# Patient Record
Sex: Male | Born: 1955 | Race: White | Hispanic: No | Smoking: Former smoker
Health system: Southern US, Community
[De-identification: ages and names within clinical notes are randomized; demographics above are authoritative.]

## PROBLEM LIST (undated history)

## (undated) DIAGNOSIS — I2510 Atherosclerotic heart disease of native coronary artery without angina pectoris: Secondary | ICD-10-CM

## (undated) DIAGNOSIS — I519 Heart disease, unspecified: Secondary | ICD-10-CM

## (undated) DIAGNOSIS — Z87891 Personal history of nicotine dependence: Secondary | ICD-10-CM

## (undated) DIAGNOSIS — Z91199 Patient's noncompliance with other medical treatment and regimen due to unspecified reason: Secondary | ICD-10-CM

## (undated) DIAGNOSIS — Z95810 Presence of automatic (implantable) cardiac defibrillator: Secondary | ICD-10-CM

## (undated) DIAGNOSIS — E785 Hyperlipidemia, unspecified: Secondary | ICD-10-CM

## (undated) DIAGNOSIS — Z87442 Personal history of urinary calculi: Secondary | ICD-10-CM

## (undated) DIAGNOSIS — I2109 ST elevation (STEMI) myocardial infarction involving other coronary artery of anterior wall: Secondary | ICD-10-CM

## (undated) DIAGNOSIS — I10 Essential (primary) hypertension: Secondary | ICD-10-CM

## (undated) DIAGNOSIS — J449 Chronic obstructive pulmonary disease, unspecified: Secondary | ICD-10-CM

## (undated) DIAGNOSIS — Z9119 Patient's noncompliance with other medical treatment and regimen: Secondary | ICD-10-CM

## (undated) DIAGNOSIS — N289 Disorder of kidney and ureter, unspecified: Secondary | ICD-10-CM

## (undated) HISTORY — DX: Personal history of nicotine dependence: Z87.891

## (undated) HISTORY — DX: Essential (primary) hypertension: I10

## (undated) HISTORY — DX: Heart disease, unspecified: I51.9

## (undated) HISTORY — DX: Atherosclerotic heart disease of native coronary artery without angina pectoris: I25.10

## (undated) HISTORY — DX: Patient's noncompliance with other medical treatment and regimen: Z91.19

## (undated) HISTORY — DX: Disorder of kidney and ureter, unspecified: N28.9

## (undated) HISTORY — PX: CORONARY ANGIOPLASTY WITH STENT PLACEMENT: SHX49

## (undated) HISTORY — DX: Hyperlipidemia, unspecified: E78.5

## (undated) HISTORY — PX: TONSILLECTOMY AND ADENOIDECTOMY: SUR1326

## (undated) HISTORY — PX: TYMPANOPLASTY: SHX33

## (undated) HISTORY — DX: Patient's noncompliance with other medical treatment and regimen due to unspecified reason: Z91.199

---

## 2001-05-01 ENCOUNTER — Other Ambulatory Visit: Admission: RE | Admit: 2001-05-01 | Discharge: 2001-05-01 | Payer: Self-pay | Admitting: Otolaryngology

## 2002-09-02 ENCOUNTER — Emergency Department (HOSPITAL_COMMUNITY): Admission: EM | Admit: 2002-09-02 | Discharge: 2002-09-02 | Payer: Self-pay | Admitting: Emergency Medicine

## 2003-08-18 ENCOUNTER — Ambulatory Visit (HOSPITAL_COMMUNITY): Admission: RE | Admit: 2003-08-18 | Discharge: 2003-08-19 | Payer: Self-pay | Admitting: Cardiovascular Disease

## 2003-10-17 DIAGNOSIS — I2109 ST elevation (STEMI) myocardial infarction involving other coronary artery of anterior wall: Secondary | ICD-10-CM

## 2003-10-17 HISTORY — DX: ST elevation (STEMI) myocardial infarction involving other coronary artery of anterior wall: I21.09

## 2003-12-03 ENCOUNTER — Emergency Department (HOSPITAL_COMMUNITY): Admission: EM | Admit: 2003-12-03 | Discharge: 2003-12-03 | Payer: Self-pay | Admitting: Emergency Medicine

## 2003-12-05 ENCOUNTER — Emergency Department (HOSPITAL_COMMUNITY): Admission: EM | Admit: 2003-12-05 | Discharge: 2003-12-05 | Payer: Self-pay | Admitting: Emergency Medicine

## 2003-12-10 ENCOUNTER — Inpatient Hospital Stay (HOSPITAL_COMMUNITY): Admission: EM | Admit: 2003-12-10 | Discharge: 2003-12-19 | Payer: Self-pay | Admitting: Emergency Medicine

## 2003-12-10 ENCOUNTER — Emergency Department (HOSPITAL_COMMUNITY): Admission: EM | Admit: 2003-12-10 | Discharge: 2003-12-10 | Payer: Self-pay | Admitting: Emergency Medicine

## 2005-11-30 IMAGING — CT CT PELVIS W/O CM
1 series · 15 of 32 positions shown, 19 images · non-contrast
Comparison: None.

** THIS REPORT HAS BEEN UPDATED TO INCLUDE ALL ASSOCIATED EXAMS ? 12/07/03**
CLINICAL DATA: 47-year-old male with left flank pain and nausea.  Evaluate for kidney stones. 
 ABDOMEN AND PELVIS CT WITHOUT CONTRAST ? 12/03/03

[Series 2: renal stone · axial · 0.73mm/px · z∈[-486,-121]mm · 15 of 82 slices shown, 19 images]
[im 6/82  soft-tissue]
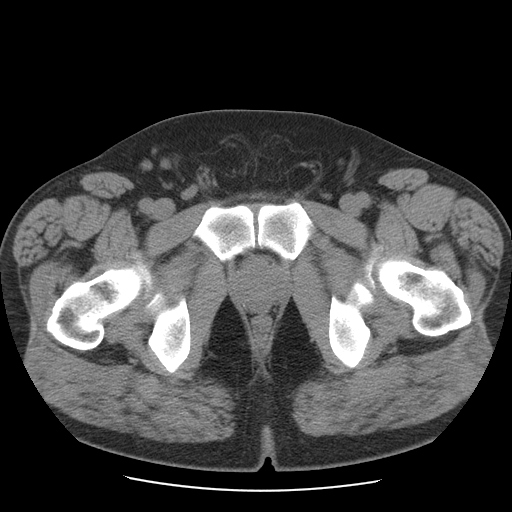
[im 6/82  bone]
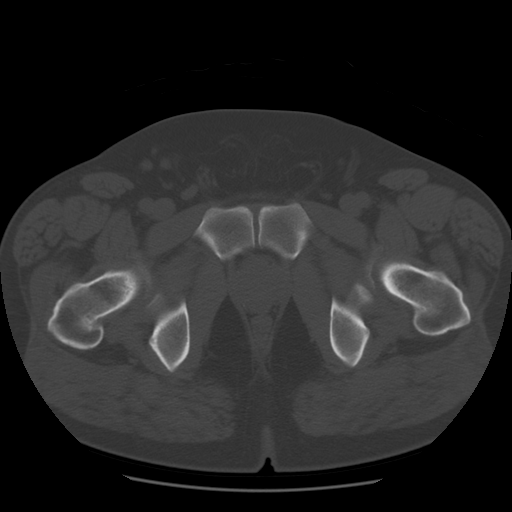
[im 11/82  soft-tissue]
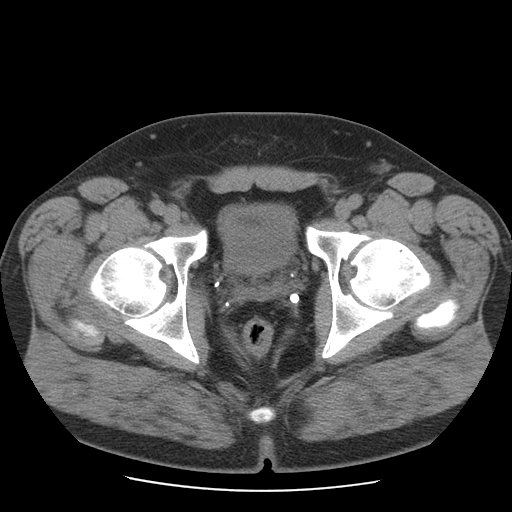
[im 16/82  soft-tissue]
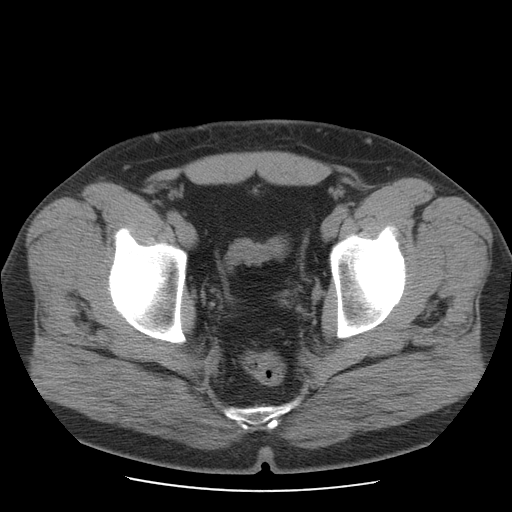
[im 24/82  soft-tissue]
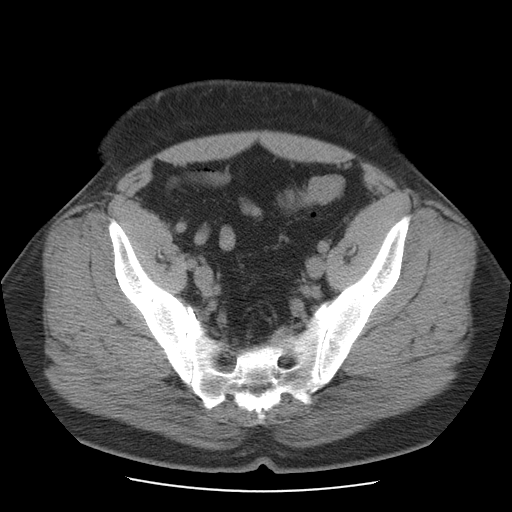
[im 29/82  soft-tissue]
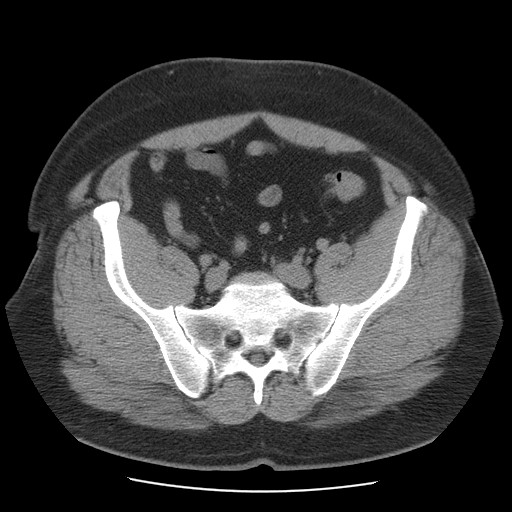
[im 34/82  soft-tissue]
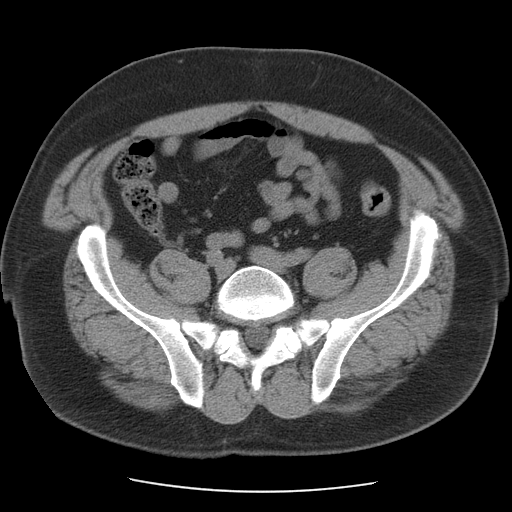
[im 42/82  soft-tissue]
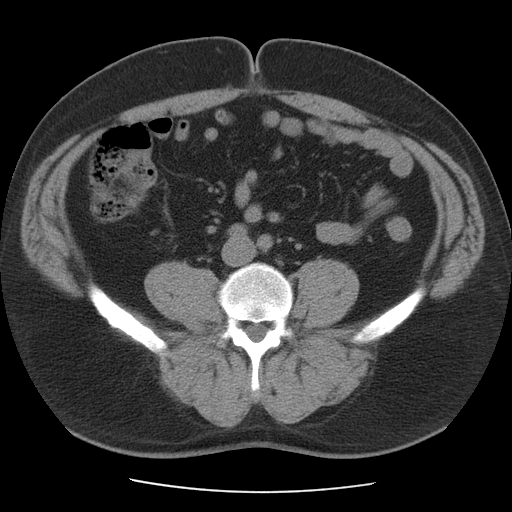
[im 48/82  soft-tissue]
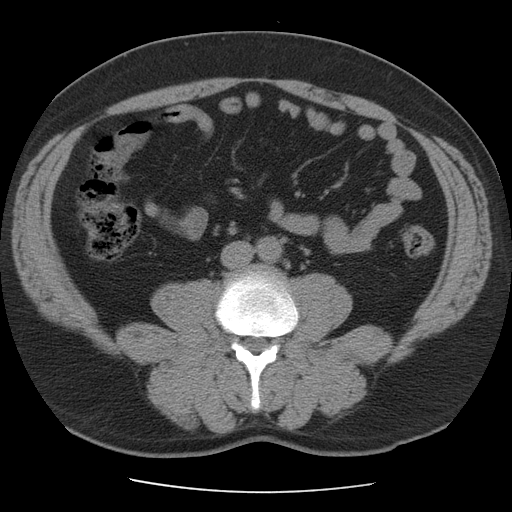
[im 53/82  soft-tissue]
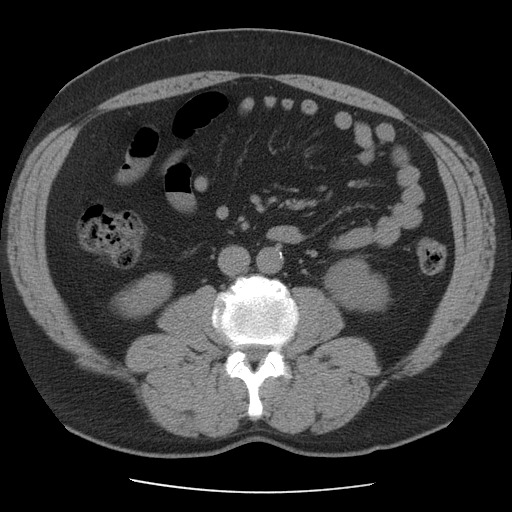
[im 53/82  bone]
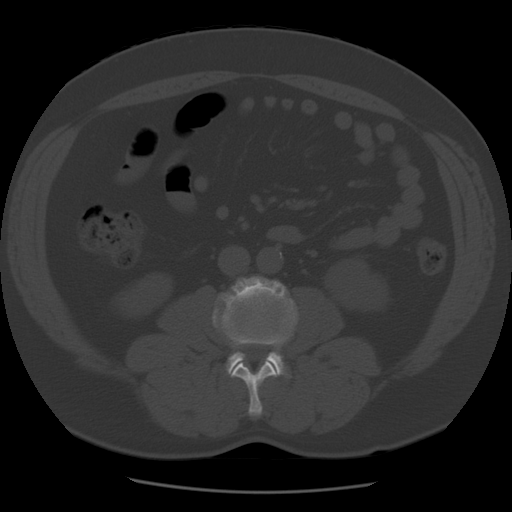
[im 58/82  soft-tissue]
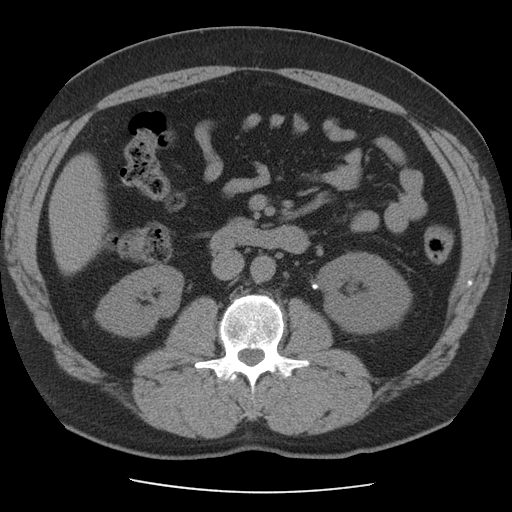
[im 66/82  soft-tissue]
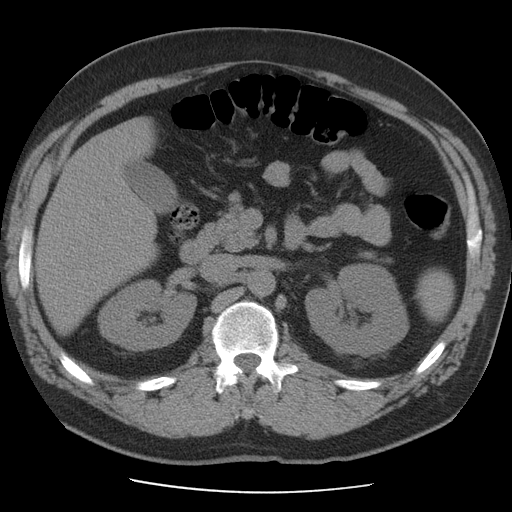
[im 71/82  soft-tissue]
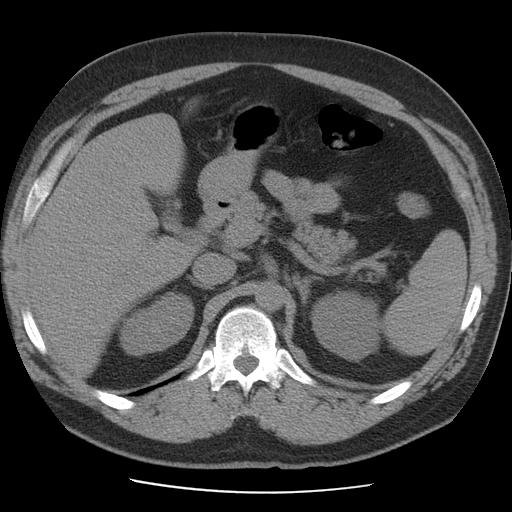
[im 71/82  lung]
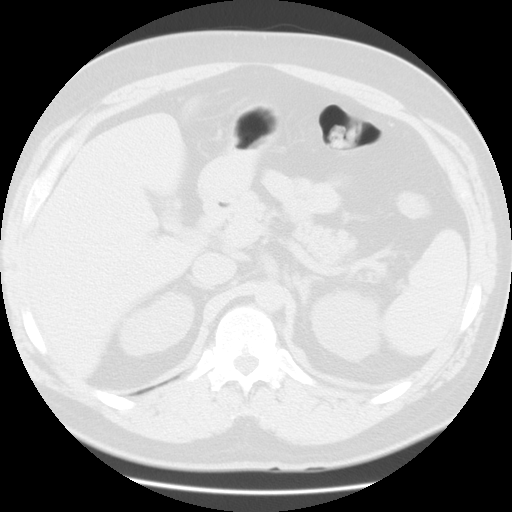
[im 74/82  lung]
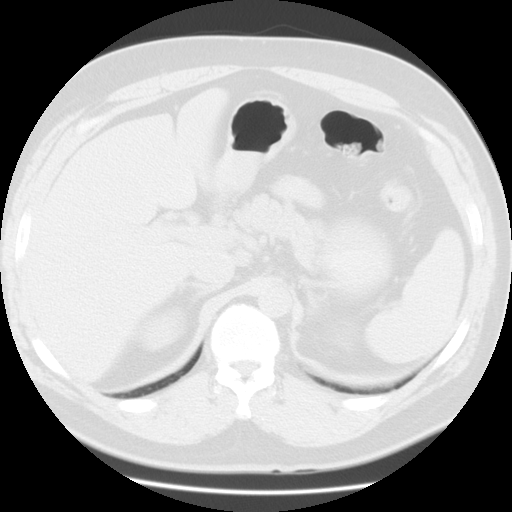
[im 76/82  soft-tissue]
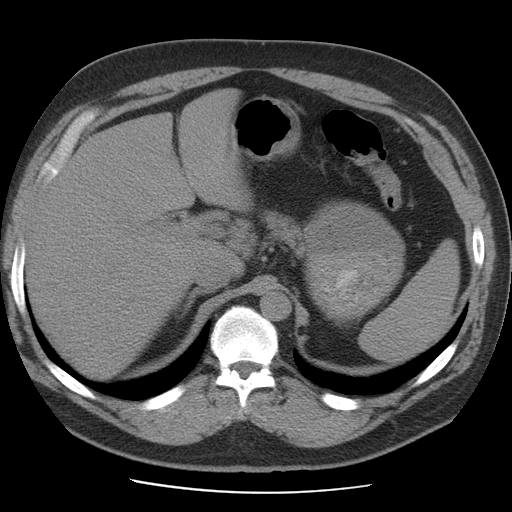
[im 76/82  lung]
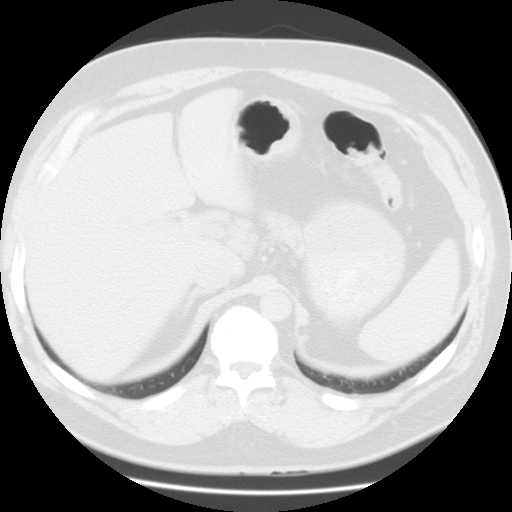
[im 79/82  lung]
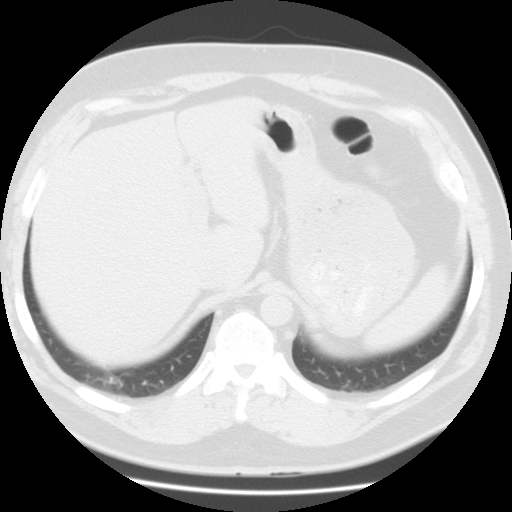

[15 of 32 positions shown; findings below may reference images not displayed]

5-mm contiguous axial images were obtained from the upper abdomen to the pubic symphysis without oral or IV contrast.
FINDINGS: ABDOMEN 
 The liver and spleen have normal uninfused features although neither organ has been imaged in its entirety, and the lack of intravenous contrast lessens sensitivity for evaluation.  The gallbladder, pancreas, duodenum, and adrenal glands are unremarkable.  There is no free fluid or lymphadenopathy.  Two non-obstructing stones are seen in the right kidney, the larger measuring approximately 2.0 mm.  A 4.0-mm stone is seen at the left ureteropelvic junction with mild left perinephric and peripelvic stranding.
IMPRESSION: A 4.0-mm left ureteropelvic junction stone generates no hydronephrosis, but there is mild left perinephric edema.  
 Non-obstructing right renal calculi.
 PELVIS 
 Imaging through the anatomic pelvis shows no distal ureteral or bladder calculi.  There is no evidence for free fluid or lymphadenopathy.  Diverticular change characterizes the sigmoid colon.
IMPRESSION: Sigmoid diverticulosis in an otherwise unremarkable CT exam of the pelvis.

## 2005-12-09 IMAGING — CR DG CHEST 1V PORT
1 series · 1 of 1 positions shown · non-contrast
Comparison: none

CLINICAL DATA: Acute MI
SINGLE PORTABLE CHEST RADIOGRAPH, 12/12/03, 6600 HOURS
Comparison 12/10/03.

[view not recorded]
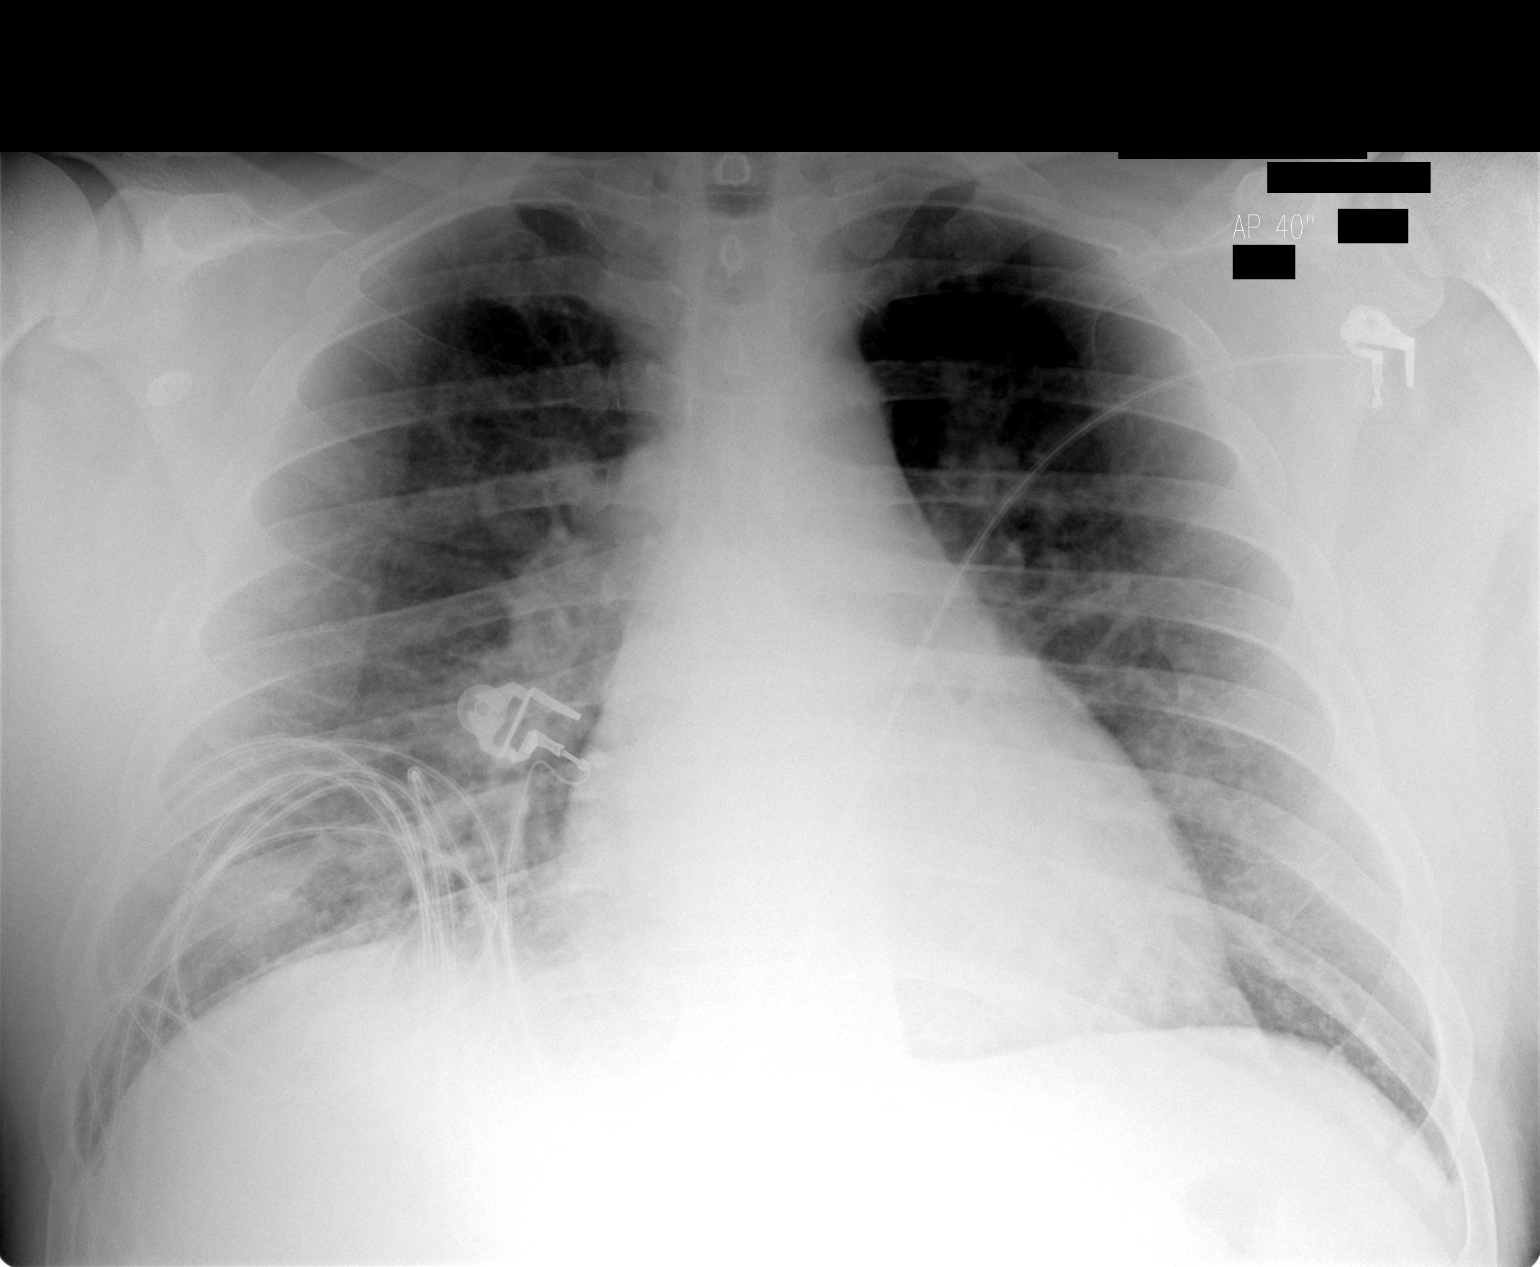

[1 of 1 positions shown; findings below may reference images not displayed]

FINDINGS: There is still cardiomegaly and central vascular congestion but no definite CHF.  Right lower lobe atelectasis is suspected, partially obscured by telemetry leads.  
IMPRESSION 
Persistent cardiomegaly and vascular congestion.
Minor right lower lobe atelectasis.

## 2013-04-12 ENCOUNTER — Other Ambulatory Visit: Payer: Self-pay | Admitting: Cardiovascular Disease

## 2013-04-14 NOTE — Telephone Encounter (Signed)
Rx was sent to pharmacy electronically. 

## 2013-05-18 ENCOUNTER — Other Ambulatory Visit: Payer: Self-pay | Admitting: Cardiovascular Disease

## 2013-05-19 ENCOUNTER — Other Ambulatory Visit: Payer: Self-pay | Admitting: *Deleted

## 2013-05-19 MED ORDER — CLOPIDOGREL BISULFATE 75 MG PO TABS: 75.0000 mg | ORAL_TABLET | Freq: Every day | ORAL | Status: DC

## 2013-05-19 NOTE — Telephone Encounter (Signed)
Rx was sent to pharmacy electronically. 

## 2013-06-03 ENCOUNTER — Other Ambulatory Visit: Payer: Self-pay | Admitting: Cardiovascular Disease

## 2013-07-01 ENCOUNTER — Other Ambulatory Visit: Payer: Self-pay | Admitting: Cardiovascular Disease

## 2013-07-01 NOTE — Telephone Encounter (Signed)
Rx was sent to pharmacy electronically. 

## 2013-07-09 ENCOUNTER — Other Ambulatory Visit: Payer: Self-pay | Admitting: Cardiovascular Disease

## 2013-07-09 NOTE — Telephone Encounter (Signed)
Rx was sent to pharmacy electronically. 

## 2013-07-23 ENCOUNTER — Encounter: Payer: Self-pay | Admitting: Cardiovascular Disease

## 2013-07-23 ENCOUNTER — Ambulatory Visit (INDEPENDENT_AMBULATORY_CARE_PROVIDER_SITE_OTHER): Payer: 59 | Admitting: Cardiovascular Disease

## 2013-07-23 VITALS — BP 132/80 | HR 79 | Ht 71.0 in | Wt 260.8 lb

## 2013-07-23 DIAGNOSIS — I2510 Atherosclerotic heart disease of native coronary artery without angina pectoris: Secondary | ICD-10-CM

## 2013-07-23 DIAGNOSIS — E7800 Pure hypercholesterolemia, unspecified: Secondary | ICD-10-CM | POA: Insufficient documentation

## 2013-07-23 DIAGNOSIS — E785 Hyperlipidemia, unspecified: Secondary | ICD-10-CM

## 2013-07-23 DIAGNOSIS — I10 Essential (primary) hypertension: Secondary | ICD-10-CM

## 2013-07-23 NOTE — Patient Instructions (Signed)
Your physician recommends that you schedule a follow-up appointment in: 1 year  

## 2013-07-23 NOTE — Assessment & Plan Note (Signed)
Well-controlled on current medications 

## 2013-07-23 NOTE — Progress Notes (Signed)
07/23/2013 Tyrone Haas   [birth year]  [id number]  Primary Physician No PCP Per Patient Primary Cardiologist: Runell Gess MD [name]   HPI:  The patient is a 57 year old mildly overweight separated Caucasian male, father of 4 children, who I last saw a year ago. He has a history of CAD status post LAD and diagonal branch bifurcation stenting, November of 2004, by myself. 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 He was studied urgently and underwent thrombectomy and re-intervention. At his initial catheterization, EF was 50%, but subsequent to that, at the time of his second catheterization, his EF was 25%, and by echocardiogram 3 years ago EF had come up to 35% to 40%. Myoview showed a large scar in the LAD territory. He stopped smoking at that time. His other problems include hypertension and hyperlipidemia. He denies chest pain or shortness of breath    Current Outpatient Prescriptions  Medication Sig Dispense Refill  . ALREX 0.2 % SUSP Place 1 drop into both eyes daily.      Marland Kitchen atorvastatin (LIPITOR) 40 MG tablet TAKE 1 TABLET AT BEDTIME  15 tablet  0  . carvedilol (COREG) 6.25 MG tablet Take 1 tablet by mouth daily.      . clopidogrel (PLAVIX) 75 MG tablet TAKE 1 TABLET (75 MG TOTAL) BY MOUTH DAILY.  15 tablet  0  . NIASPAN 500 MG CR tablet TAKE 1 TABLET BY MOUTH EVERY DAY  15 tablet  0  . ramipril (ALTACE) 2.5 MG capsule TAKE 1 CAPSULE EVERY DAY  15 capsule  0   No current facility-administered medications for this visit.    Not on File  History   Social History  . Marital Status: Divorced    Spouse Name: N/A    Number of Children: N/A  . Years of Education: N/A   Occupational History  . Not on file.   Social History Main Topics  . Smoking status: Former Smoker    Quit date: 07/23/2009  . Smokeless tobacco: Not on file  .  Alcohol Use: Yes  . Drug Use: No  . Sexual Activity: Not on file   Other Topics Concern  . Not on file   Social History Narrative  . No narrative on file     Review of Systems: General: negative for chills, fever, night sweats or weight changes.  Cardiovascular: negative for chest pain, dyspnea on exertion, edema, orthopnea, palpitations, paroxysmal nocturnal dyspnea or shortness of breath Dermatological: negative for rash Respiratory: negative for cough or wheezing Urologic: negative for hematuria Abdominal: negative for nausea, vomiting, diarrhea, bright red blood per rectum, melena, or hematemesis Neurologic: negative for visual changes, syncope, or dizziness All other systems reviewed and are otherwise negative except as noted above.    Blood pressure 132/80, pulse 79, height 5\' 11"  (1.803 m), weight 260 lb 12.8 oz (118.298 kg).  General appearance: alert and no distress Neck: no adenopathy, no carotid bruit, no JVD, supple, symmetrical, trachea midline and thyroid not enlarged, symmetric, no tenderness/mass/nodules Lungs: clear to auscultation bilaterally Heart: regular rate and rhythm, S1, S2 normal, no murmur, click, rub or gallop Extremities: extremities normal, atraumatic, no cyanosis or edema  EKG sinus rhythm at 79 with Q waves in V1 through V5 and low limb voltage  ASSESSMENT AND PLAN:   Coronary artery disease Status post LAD diagonal branch bifurcation stenting November 2004. 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 Urgently studied and underwent thrombectomy every intervention. Is initially it was 55% which was 25% acutely and ultimately improved to 35-40%. Myoview showed a large scar in the anterior wall. He denies chest pain or shortness of breath.  Essential hypertension Well-controlled on current medications  Hyperlipidemia On statin  therapy followed by his PCP      Runell Gess MD [facility], [facility] 07/23/2013 4:10 PM

## 2013-07-23 NOTE — Assessment & Plan Note (Signed)
On statin therapy followed by his PCP 

## 2013-07-23 NOTE — Assessment & Plan Note (Signed)
Status post LAD diagonal branch bifurcation stenting November 2004. 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 Urgently studied and underwent thrombectomy every intervention. Is initially it was 55% which was 25% acutely and ultimately improved to 35-40%. Myoview showed a large scar in the anterior wall. He denies chest pain or shortness of breath.

## 2013-07-24 ENCOUNTER — Encounter: Payer: Self-pay | Admitting: Cardiovascular Disease

## 2013-07-28 ENCOUNTER — Other Ambulatory Visit: Payer: Self-pay | Admitting: Cardiovascular Disease

## 2013-07-28 NOTE — Telephone Encounter (Signed)
Rx was sent to pharmacy electronically. 

## 2013-11-20 ENCOUNTER — Other Ambulatory Visit: Payer: Self-pay | Admitting: Cardiovascular Disease

## 2013-11-20 NOTE — Telephone Encounter (Signed)
Rx was sent to pharmacy electronically. 

## 2014-01-20 ENCOUNTER — Other Ambulatory Visit: Payer: Self-pay

## 2014-01-20 MED ORDER — ATORVASTATIN CALCIUM 40 MG PO TABS: 40.0000 mg | ORAL_TABLET | Freq: Every day | ORAL | Status: DC

## 2014-01-20 MED ORDER — CLOPIDOGREL BISULFATE 75 MG PO TABS
75.0000 mg | ORAL_TABLET | Freq: Every day | ORAL | Status: DC
Start: 2014-01-20 — End: 2014-08-24

## 2014-01-20 NOTE — Telephone Encounter (Signed)
Rx was sent to pharmacy electronically. 

## 2014-01-21 ENCOUNTER — Other Ambulatory Visit: Payer: Self-pay | Admitting: *Deleted

## 2014-01-23 ENCOUNTER — Other Ambulatory Visit: Payer: Self-pay | Admitting: *Deleted

## 2014-01-23 MED ORDER — RAMIPRIL 2.5 MG PO CAPS: 2.5000 mg | ORAL_CAPSULE | Freq: Every day | ORAL | Status: DC

## 2014-03-10 ENCOUNTER — Other Ambulatory Visit: Payer: Self-pay

## 2014-03-10 MED ORDER — NIACIN ER (ANTIHYPERLIPIDEMIC) 500 MG PO TBCR: 500.0000 mg | EXTENDED_RELEASE_TABLET | Freq: Every day | ORAL | Status: DC

## 2014-03-10 NOTE — Telephone Encounter (Signed)
Rx was sent to pharmacy electronically. 

## 2014-08-24 ENCOUNTER — Other Ambulatory Visit: Payer: Self-pay | Admitting: Cardiovascular Disease

## 2014-08-24 NOTE — Telephone Encounter (Signed)
Rx refill sent to patient pharmacy   

## 2014-09-17 ENCOUNTER — Other Ambulatory Visit: Payer: Self-pay | Admitting: Cardiovascular Disease

## 2014-09-26 ENCOUNTER — Other Ambulatory Visit: Payer: Self-pay | Admitting: Cardiovascular Disease

## 2014-09-28 NOTE — Telephone Encounter (Signed)
Rx refill sent to patient pharmacy  With note that appointment must be made for additional refills

## 2014-10-18 ENCOUNTER — Other Ambulatory Visit: Payer: Self-pay | Admitting: Cardiovascular Disease

## 2014-10-24 ENCOUNTER — Other Ambulatory Visit: Payer: Self-pay | Admitting: Cardiovascular Disease

## 2014-10-26 ENCOUNTER — Other Ambulatory Visit: Payer: Self-pay | Admitting: Cardiovascular Disease

## 2014-10-28 NOTE — Telephone Encounter (Signed)
Rx(s) sent to pharmacy electronically. Last OV 07/2013.  Note given to scheduler to contact patient for appointment.

## 2014-10-28 NOTE — Telephone Encounter (Signed)
Rx(s) sent to pharmacy electronically. OV 07/2013 Note given to scheduler that patient needs OV

## 2014-10-28 NOTE — Telephone Encounter (Signed)
Rx(s) sent to pharmacy electronically. Note given to scheduler to contact patient for appointment

## 2014-11-08 ENCOUNTER — Other Ambulatory Visit: Payer: Self-pay | Admitting: Cardiovascular Disease

## 2014-11-09 ENCOUNTER — Other Ambulatory Visit: Payer: Self-pay | Admitting: Cardiovascular Disease

## 2014-11-11 NOTE — Telephone Encounter (Signed)
Rx(s) sent to pharmacy electronically. Staff message sent to [facility] (scheduler) to contact patient for appointment

## 2014-11-11 NOTE — Telephone Encounter (Signed)
Rx(s) sent to pharmacy electronically. Staff message sent to [facility] (scheduler) to contact patient for appointment

## 2014-11-11 NOTE — Telephone Encounter (Signed)
Need new prescriptions for Carvedilol ,Clopidogrel and Atorvastatin #90 and refills please.

## 2014-11-13 ENCOUNTER — Telehealth: Payer: Self-pay | Admitting: Cardiovascular Disease

## 2014-11-24 ENCOUNTER — Telehealth: Payer: Self-pay | Admitting: Cardiovascular Disease

## 2014-11-24 ENCOUNTER — Other Ambulatory Visit: Payer: Self-pay | Admitting: Cardiovascular Disease

## 2014-11-24 NOTE — Telephone Encounter (Signed)
Rx(s) sent to pharmacy electronically. Patient needs an appointment. Message routed to scheduler to call patient to schedule an appointment.

## 2014-11-25 ENCOUNTER — Other Ambulatory Visit: Payer: Self-pay | Admitting: Cardiovascular Disease

## 2014-11-25 NOTE — Telephone Encounter (Signed)
Rx(s) sent to pharmacy electronically. 10-day supply sent to pharmacy. Patient hasn't made appointment and we have not been able to get in touch with patient to schedule an appointment.

## 2014-11-25 NOTE — Telephone Encounter (Signed)
Closed encounter °

## 2014-12-03 ENCOUNTER — Other Ambulatory Visit: Payer: Self-pay | Admitting: Cardiovascular Disease

## 2014-12-04 NOTE — Telephone Encounter (Signed)
Patient needs to make appointment. Attempted to call but patient does not have VM and will send calls to VM. Patient needs appointment. Plavix refilled #5, Lipitor and Ramipril denied and req. To be sent to PCP

## 2014-12-05 ENCOUNTER — Other Ambulatory Visit: Payer: Self-pay | Admitting: Cardiovascular Disease

## 2014-12-07 NOTE — Telephone Encounter (Signed)
Spoke with pharmacy to confirm patients phone number. Number was changed in patients chart. Patient needs to make an appointment for additional refills. Patient has not been seen since Oct.2014.

## 2014-12-11 ENCOUNTER — Other Ambulatory Visit: Payer: Self-pay

## 2014-12-11 MED ORDER — CLOPIDOGREL BISULFATE 75 MG PO TABS: ORAL_TABLET | ORAL | Status: DC

## 2014-12-22 ENCOUNTER — Other Ambulatory Visit: Payer: Self-pay | Admitting: Cardiovascular Disease

## 2014-12-28 ENCOUNTER — Other Ambulatory Visit: Payer: Self-pay | Admitting: Cardiovascular Disease

## 2015-01-06 ENCOUNTER — Other Ambulatory Visit: Payer: Self-pay | Admitting: Cardiovascular Disease

## 2015-01-06 NOTE — Telephone Encounter (Signed)
Rx(s) sent to pharmacy electronically. OV 01/11/15 with Dr. Gwenlyn Found

## 2015-01-09 ENCOUNTER — Other Ambulatory Visit: Payer: Self-pay | Admitting: Cardiovascular Disease

## 2015-01-11 ENCOUNTER — Ambulatory Visit (INDEPENDENT_AMBULATORY_CARE_PROVIDER_SITE_OTHER): Payer: 59 | Admitting: Cardiovascular Disease

## 2015-01-11 ENCOUNTER — Encounter: Payer: Self-pay | Admitting: Cardiovascular Disease

## 2015-01-11 VITALS — BP 120/70 | HR 66 | Ht 71.0 in | Wt 248.9 lb

## 2015-01-11 DIAGNOSIS — I10 Essential (primary) hypertension: Secondary | ICD-10-CM

## 2015-01-11 DIAGNOSIS — E785 Hyperlipidemia, unspecified: Secondary | ICD-10-CM

## 2015-01-11 DIAGNOSIS — I2510 Atherosclerotic heart disease of native coronary artery without angina pectoris: Secondary | ICD-10-CM

## 2015-01-11 DIAGNOSIS — I2583 Coronary atherosclerosis due to lipid rich plaque: Secondary | ICD-10-CM

## 2015-01-11 NOTE — Assessment & Plan Note (Signed)
History of hypertension blood pressure measured at 120/70. He is on carvedilol and ramipril. Continue current meds at current dosing

## 2015-01-11 NOTE — Progress Notes (Signed)
01/11/2015 Tyrone Haas   [birth year]  [id number]  Primary Physician Bonnita Nasuti, MD Primary Cardiologist: Lorretta Harp MD [name]   HPI:  The patient is a 59 year old mildly overweight separated Caucasian male, father of 4 children, who I last saw 07/23/13.Tyrone Haas He has a history of CAD status post LAD and diagonal branch bifurcation stenting, November of 2004, by myself. 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 He was studied urgently and underwent thrombectomy and re-intervention. At his initial catheterization, EF was 50%, but subsequent to that, at the time of his second catheterization, his EF was 25%, and by echocardiogram 3 years ago EF had come up to 35% to 40%. Myoview showed a large scar in the LAD territory. He stopped smoking at that time. His other problems include hypertension and hyperlipidemia. He denies chest pain or shortness of breath   Current Outpatient Prescriptions  Medication Sig Dispense Refill  . atorvastatin (LIPITOR) 40 MG tablet Take 1 tablet (40 mg total) by mouth daily. 30 tablet 0  . carvedilol (COREG) 6.25 MG tablet TAKE 1/2 TABLET BY MOUTH 2 (TWO) TIMES DAILY. **MUST MAKE APPOINTMENT FOR REFILLS** 10 tablet 0  . clopidogrel (PLAVIX) 75 MG tablet Keep appointment with Dr.Alijah Akram 3//28/16 30 tablet 0  . NIASPAN 500 MG CR tablet TAKE 1 TABLET BY MOUTH AT BEDTIME MUST MAKE APPT FOR REFILLS 20 tablet 0  . ramipril (ALTACE) 2.5 MG capsule Take 1 capsule (2.5 mg total) by mouth daily. 30 capsule 0   No current facility-administered medications for this visit.    No Known Allergies  History   Social History  . Marital Status: Divorced    Spouse Name: N/A  . Number of Children: N/A  . Years of Education: N/A   Occupational History  . Not on file.   Social History Main Topics  . Smoking status: Former Smoker    Quit  date: 07/23/2009  . Smokeless tobacco: Not on file  . Alcohol Use: Yes  . Drug Use: No  . Sexual Activity: Not on file   Other Topics Concern  . Not on file   Social History Narrative     Review of Systems: General: negative for chills, fever, night sweats or weight changes.  Cardiovascular: negative for chest pain, dyspnea on exertion, edema, orthopnea, palpitations, paroxysmal nocturnal dyspnea or shortness of breath Dermatological: negative for rash Respiratory: negative for cough or wheezing Urologic: negative for hematuria Abdominal: negative for nausea, vomiting, diarrhea, bright red blood per rectum, melena, or hematemesis Neurologic: negative for visual changes, syncope, or dizziness All other systems reviewed and are otherwise negative except as noted above.    Blood pressure 120/70, pulse 66, height 5\' 11"  (1.803 m), weight 248 lb 14.4 oz (112.9 kg).  General appearance: alert and no distress Neck: no adenopathy, no carotid bruit, no JVD, supple, symmetrical, trachea midline and thyroid not enlarged, symmetric, no tenderness/mass/nodules Lungs: clear to auscultation bilaterally Heart: regular rate and rhythm, S1, S2 normal, no murmur, click, rub or gallop Extremities: extremities normal, atraumatic, no cyanosis or edema  EKG normal sinus rhythm at 66 with Q waves in V1 through V4 and low limb voltage unchanged from prior EKGs. I personally reviewed this EKG  ASSESSMENT AND PLAN:   Hyperlipidemia History of hyperlipidemia on atorvastatin 40 mg a day followed by his PCP   Essential hypertension History of hypertension blood pressure  measured at 120/70. He is on carvedilol and ramipril. Continue current meds at current dosing   Coronary artery disease History of coronary artery disease status post LAD and diagonal branch bifurcation stenting November 2004 by myself. Unfortunately he stopped his Plavix presented 12/10/03 with a large anterior wall cardial infarction  related to subacute stent thrombosis. He was urgently studied and underwent thrombectomy and reactive intervention. His EF at that time was 25% which all to wake him up to 35-40% 3 years later. Myoview stress test that showed a large scar in the LAD territory. He denies chest pain or shortness of breath.       Lorretta Harp MD FACP,FACC,FAHA, [facility] 01/11/2015 4:07 PM

## 2015-01-11 NOTE — Assessment & Plan Note (Signed)
History of hyperlipidemia on atorvastatin 40 mg a day followed by his PCP 

## 2015-01-11 NOTE — Telephone Encounter (Signed)
Rx(s) sent to pharmacy electronically.  

## 2015-01-11 NOTE — Assessment & Plan Note (Signed)
History of coronary artery disease status post LAD and diagonal branch bifurcation stenting November 2004 by myself. 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 He was urgently studied and underwent thrombectomy and reactive intervention. His EF at that time was 25% which all to wake him up to 35-40% 3 years later. Myoview stress test that showed a large scar in the LAD territory. He denies chest pain or shortness of breath.

## 2015-01-11 NOTE — Patient Instructions (Signed)
Dr.Berry wants you to follow-up in: ONE YEAR. You will receive a reminder letter in the mail two months in advance. If you don't receive a letter, please call our office to schedule the follow-up appointment.

## 2015-01-12 ENCOUNTER — Encounter: Payer: Self-pay | Admitting: Cardiovascular Disease

## 2015-01-16 ENCOUNTER — Other Ambulatory Visit: Payer: Self-pay | Admitting: Cardiovascular Disease

## 2015-01-18 NOTE — Telephone Encounter (Signed)
Rx(s) sent to pharmacy electronically.  

## 2015-01-20 ENCOUNTER — Other Ambulatory Visit: Payer: Self-pay | Admitting: Cardiovascular Disease

## 2015-01-20 NOTE — Telephone Encounter (Signed)
Rx(s) sent to pharmacy electronically.  

## 2015-02-03 ENCOUNTER — Other Ambulatory Visit: Payer: Self-pay | Admitting: Cardiovascular Disease

## 2015-02-04 NOTE — Telephone Encounter (Signed)
Rx(s) sent to pharmacy electronically.  

## 2015-04-29 ENCOUNTER — Encounter: Payer: Self-pay | Admitting: Cardiovascular Disease

## 2016-01-08 ENCOUNTER — Other Ambulatory Visit: Payer: Self-pay | Admitting: Cardiovascular Disease

## 2016-01-11 NOTE — Telephone Encounter (Signed)
REFILL 

## 2016-02-05 ENCOUNTER — Other Ambulatory Visit: Payer: Self-pay | Admitting: Cardiovascular Disease

## 2016-02-07 NOTE — Telephone Encounter (Signed)
REFILL 

## 2016-02-08 ENCOUNTER — Other Ambulatory Visit: Payer: Self-pay | Admitting: Cardiovascular Disease

## 2016-02-08 NOTE — Telephone Encounter (Signed)
Rx request sent to pharmacy.  

## 2016-03-09 ENCOUNTER — Other Ambulatory Visit: Payer: Self-pay | Admitting: Cardiovascular Disease

## 2016-03-09 NOTE — Telephone Encounter (Signed)
Rx(s) sent to pharmacy electronically.  

## 2016-03-10 ENCOUNTER — Other Ambulatory Visit: Payer: Self-pay | Admitting: Cardiovascular Disease

## 2016-03-10 NOTE — Telephone Encounter (Signed)
Rx(s) sent to pharmacy electronically.  

## 2016-04-14 ENCOUNTER — Other Ambulatory Visit: Payer: Self-pay | Admitting: Cardiovascular Disease

## 2016-04-14 NOTE — Telephone Encounter (Signed)
Pt MUST Keep appointment on 05/05/16 to get anymore refills

## 2016-04-16 ENCOUNTER — Other Ambulatory Visit: Payer: Self-pay | Admitting: Cardiovascular Disease

## 2016-05-05 ENCOUNTER — Encounter: Payer: Self-pay | Admitting: Cardiovascular Disease

## 2016-05-05 ENCOUNTER — Ambulatory Visit (INDEPENDENT_AMBULATORY_CARE_PROVIDER_SITE_OTHER): Payer: Commercial Managed Care - HMO | Admitting: Cardiovascular Disease

## 2016-05-05 VITALS — BP 140/88 | HR 71 | Ht 70.0 in | Wt 268.0 lb

## 2016-05-05 DIAGNOSIS — I10 Essential (primary) hypertension: Secondary | ICD-10-CM

## 2016-05-05 DIAGNOSIS — Z79899 Other long term (current) drug therapy: Secondary | ICD-10-CM

## 2016-05-05 DIAGNOSIS — I2510 Atherosclerotic heart disease of native coronary artery without angina pectoris: Secondary | ICD-10-CM

## 2016-05-05 DIAGNOSIS — I2583 Coronary atherosclerosis due to lipid rich plaque: Secondary | ICD-10-CM

## 2016-05-05 DIAGNOSIS — E785 Hyperlipidemia, unspecified: Secondary | ICD-10-CM

## 2016-05-05 NOTE — Assessment & Plan Note (Signed)
History of hyperlipidemia on statin therapy. We will recheck a lipid and liver profile 

## 2016-05-05 NOTE — Assessment & Plan Note (Signed)
History of CAD status post LAD and diagonal branch bifurcation stenting November 2000 for by myself. 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 He was urgently studied and underwent aspiration thrombectomy and re-intervention. His EF initially was 25% which ultimately improved from 35-40% years later. Myoview stress test showed a large scar in the LAD territory ischemia. He denies chest pain or shortness of breath.

## 2016-05-05 NOTE — Assessment & Plan Note (Signed)
History of hypertension blood pressure measured at 140/88. He is on carvedilol and enalapril. Continue current meds at current dosing

## 2016-05-05 NOTE — Progress Notes (Signed)
05/05/2016 Tyrone Haas   1956/02/17  [id number]  Primary Physician Bonnita Nasuti, MD Primary Cardiologist: Lorretta Harp MD [name]  HPI:  The patient is a 60 year old mildly overweight separated Caucasian male, father of 4 children, who I last saw 12/2514.Marland Kitchen He has a history of CAD status post LAD and diagonal branch bifurcation stenting, November of 2004, by myself. 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 He was studied urgently and underwent thrombectomy and re-intervention. At his initial catheterization, EF was 50%, but subsequent to that, at the time of his second catheterization, his EF was 25%, and by echocardiogram 3 years ago EF had come up to 35% to 40%. Myoview showed a large scar in the LAD territory. He stopped smoking at that time. His other problems include hypertension and hyperlipidemia. He denies chest pain or shortness of breath   Current Outpatient Prescriptions  Medication Sig Dispense Refill  . atorvastatin (LIPITOR) 40 MG tablet Take 1 tablet (40 mg total) by mouth daily at 6 PM. NEED OV. 30 tablet 11  . carvedilol (COREG) 6.25 MG tablet TAKE 1 TABLET BY MOUTH 2 TIMES DAILY WITH A MEAL. PLEASE CONTACT OFFICE FOR ADDITIONAL REFILLS 2ND 30 tablet 0  . clopidogrel (PLAVIX) 75 MG tablet TAKE 1 TABLET (75 MG TOTAL) BY MOUTH DAILY. PLEASE CONTACT OFFICE FOR ADDITIONAL REFILLS 2ND WARNING 30 tablet 0  . NIASPAN 500 MG CR tablet TAKE 1 TABLET (500 MG TOTAL) BY MOUTH AT BEDTIME. PLEASE SCHEDULE APPOINTMENT FOR REFILLS. 15 tablet 2  . ramipril (ALTACE) 2.5 MG capsule TAKE 1 CAPSULE (2.5 MG TOTAL) BY MOUTH DAILY. PLEASE CONTACT OFFICE FOR ADDITIONAL 30 capsule 0   No current facility-administered medications for this visit.    No Known Allergies  Social History   Social History  . Marital Status: Divorced    Spouse Name: N/A  .  Number of Children: N/A  . Years of Education: N/A   Occupational History  . Not on file.   Social History Main Topics  . Smoking status: Former Smoker    Quit date: 07/23/2009  . Smokeless tobacco: Not on file  . Alcohol Use: Yes  . Drug Use: No  . Sexual Activity: Not on file   Other Topics Concern  . Not on file   Social History Narrative     Review of Systems: General: negative for chills, fever, night sweats or weight changes.  Cardiovascular: negative for chest pain, dyspnea on exertion, edema, orthopnea, palpitations, paroxysmal nocturnal dyspnea or shortness of breath Dermatological: negative for rash Respiratory: negative for cough or wheezing Urologic: negative for hematuria Abdominal: negative for nausea, vomiting, diarrhea, bright red blood per rectum, melena, or hematemesis Neurologic: negative for visual changes, syncope, or dizziness All other systems reviewed and are otherwise negative except as noted above.    Blood pressure 140/88, pulse 71, height 5\' 10"  (1.778 m), weight 268 lb (121.564 kg), SpO2 98 %.  General appearance: alert and no distress Neck: no adenopathy, no carotid bruit, no JVD, supple, symmetrical, trachea midline and thyroid not enlarged, symmetric, no tenderness/mass/nodules Lungs: clear to auscultation bilaterally Heart: regular rate and rhythm, S1, S2 normal, no murmur, click, rub or gallop Extremities: extremities normal, atraumatic, no cyanosis or edema  EKG normal sinus rhythm at 71 with Q waves across his precordium suggesting a large old anteroseptal myocardial infarction. This is unchanged from  prior EKGs. I personally reviewed this EKG.  ASSESSMENT AND PLAN:   Coronary artery disease History of CAD status post LAD and diagonal branch bifurcation stenting November 2000 for by myself. 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 He was  urgently studied and underwent aspiration thrombectomy and re-intervention. His EF initially was 25% which ultimately improved from 35-40% years later. Myoview stress test showed a large scar in the LAD territory ischemia. He denies chest pain or shortness of breath.  Essential hypertension History of hypertension blood pressure measured at 140/88. He is on carvedilol and enalapril. Continue current meds at current dosing  Hyperlipidemia History of hyperlipidemia on statin therapy. We will recheck a lipid and liver profile      Lorretta Harp MD [facility], [facility] 05/05/2016 9:29 AM

## 2016-05-05 NOTE — Patient Instructions (Signed)
Medication Instructions:  Your physician recommends that you continue on your current medications as directed. Please refer to the Current Medication list given to you today.    Labwork: Your physician recommends that you return for lab work AT [place]. The lab can be found on the FIRST FLOOR of out building in [address]   Testing/Procedures: N/A  Follow-Up: Your physician wants you to follow-up in: [place]. You will receive a reminder letter in the mail two months in advance. If you don't receive a letter, please call our office to schedule the follow-up appointment.  If you need a refill on your cardiac medications before your next appointment, please call your pharmacy.

## 2016-05-13 ENCOUNTER — Other Ambulatory Visit: Payer: Self-pay | Admitting: Cardiovascular Disease

## 2016-05-15 NOTE — Telephone Encounter (Signed)
Rx request sent to pharmacy.  

## 2016-05-18 ENCOUNTER — Other Ambulatory Visit: Payer: Self-pay | Admitting: Cardiovascular Disease

## 2016-07-28 ENCOUNTER — Other Ambulatory Visit: Payer: Self-pay | Admitting: Cardiovascular Disease

## 2017-03-05 ENCOUNTER — Other Ambulatory Visit: Payer: Self-pay | Admitting: Cardiovascular Disease

## 2017-03-05 NOTE — Telephone Encounter (Signed)
Rx request sent to pharmacy.  

## 2017-03-19 ENCOUNTER — Other Ambulatory Visit: Payer: Self-pay | Admitting: Cardiovascular Disease

## 2017-03-19 NOTE — Telephone Encounter (Signed)
Rx request sent to pharmacy.  

## 2017-04-02 ENCOUNTER — Other Ambulatory Visit: Payer: Self-pay | Admitting: Cardiovascular Disease

## 2017-05-09 ENCOUNTER — Ambulatory Visit (INDEPENDENT_AMBULATORY_CARE_PROVIDER_SITE_OTHER): Payer: 59 | Admitting: Cardiovascular Disease

## 2017-05-09 ENCOUNTER — Encounter: Payer: Self-pay | Admitting: Cardiovascular Disease

## 2017-05-09 VITALS — BP 134/82 | HR 66 | Ht 70.0 in | Wt 268.0 lb

## 2017-05-09 DIAGNOSIS — I2583 Coronary atherosclerosis due to lipid rich plaque: Secondary | ICD-10-CM

## 2017-05-09 DIAGNOSIS — I2510 Atherosclerotic heart disease of native coronary artery without angina pectoris: Secondary | ICD-10-CM | POA: Diagnosis not present

## 2017-05-09 DIAGNOSIS — E7800 Pure hypercholesterolemia, unspecified: Secondary | ICD-10-CM

## 2017-05-09 DIAGNOSIS — I10 Essential (primary) hypertension: Secondary | ICD-10-CM | POA: Diagnosis not present

## 2017-05-09 NOTE — Progress Notes (Signed)
05/09/2017 Tyrone Haas   [birth year]  [id number]  Primary Physician Raelyn Number, MD Primary Cardiologist: Lorretta Harp MD [name]  HPI:  The patient is a 61 year old mildly overweight separated Caucasian male, father of 4 children, who I last saw 05/05/16.Marland Kitchen He has a history of CAD status post LAD and diagonal branch bifurcation stenting, November of 2004, by myself. 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 He was studied urgently and underwent thrombectomy and re-intervention. At his initial catheterization, EF was 50%, but subsequent to that, at the time of his second catheterization, his EF was 25%, and by echocardiogram 3 years ago EF had come up to 35% to 40%. Myoview showed a large scar in the LAD territory. He stopped smoking at that time. His other problems include hypertension and hyperlipidemia. He denies chest pain or shortness of breath   Current Outpatient Prescriptions  Medication Sig Dispense Refill  . atorvastatin (LIPITOR) 40 MG tablet Take 1 tablet (40 mg total) by mouth daily at 6 PM. 30 tablet 11  . carvedilol (COREG) 6.25 MG tablet Take 1 tablet (6.25 mg total) by mouth 2 (two) times daily with a meal. 30 tablet 11  . clopidogrel (PLAVIX) 75 MG tablet Take 1 tablet (75 mg total) by mouth daily. 30 tablet 11  . niacin (NIASPAN) 500 MG CR tablet Take 1 tablet (500 mg total) by mouth at bedtime. 30 tablet 6  . ramipril (ALTACE) 2.5 MG capsule Take 1 capsule (2.5 mg total) by mouth daily. 30 capsule 11   No current facility-administered medications for this visit.     No Known Allergies  Social History   Social History  . Marital status: Divorced    Spouse name: N/A  . Number of children: N/A  . Years of education: N/A   Occupational History  . Not on file.   Social History Main Topics  . Smoking status: Former Smoker      Quit date: 07/23/2009  . Smokeless tobacco: Never Used  . Alcohol use Yes  . Drug use: No  . Sexual activity: Not on file   Other Topics Concern  . Not on file   Social History Narrative  . No narrative on file     Review of Systems: General: negative for chills, fever, night sweats or weight changes.  Cardiovascular: negative for chest pain, dyspnea on exertion, edema, orthopnea, palpitations, paroxysmal nocturnal dyspnea or shortness of breath Dermatological: negative for rash Respiratory: negative for cough or wheezing Urologic: negative for hematuria Abdominal: negative for nausea, vomiting, diarrhea, bright red blood per rectum, melena, or hematemesis Neurologic: negative for visual changes, syncope, or dizziness All other systems reviewed and are otherwise negative except as noted above.    Blood pressure 134/82, pulse 66, height 5\' 10"  (1.778 m), weight 268 lb (121.6 kg).  General appearance: alert and no distress Neck: no adenopathy, no carotid bruit, no JVD, supple, symmetrical, trachea midline and thyroid not enlarged, symmetric, no tenderness/mass/nodules Lungs: clear to auscultation bilaterally Heart: regular rate and rhythm, S1, S2 normal, no murmur, click, rub or gallop Extremities: extremities normal, atraumatic, no cyanosis or edema  EKG sinus rhythm of 66 with anteroseptal myocardial infarction. I personally reviewed this EKG.  ASSESSMENT AND PLAN:   Coronary artery disease History of coronary artery disease status post LAD diagonal branch bifurcation stenting November 2004 by myself. Unfortunately, he  stopped his Plavix and presented 12/10/2003 with a large anterior wall Monocryl infarction related to acute stent thrombosis. He was studied urgently underwent thrombectomy and reactive intervention. His EF had dropped from 50% at the time of his initial catheterization done at 25% ultimately settled out to 35-40% by 2-D echo 12/22/08.. Myoview showed a large scar  in the LAD territory. He has a symptomatic.  Essential hypertension History of essential hypertension blood pressure measured 134/82. He is on carvedilol and ramipril. Continue current meds at current dosing  Hyperlipidemia History of hyperlipidemia on statin therapy followed by his PCP.      Lorretta Harp MD FACP,FACC,FAHA, [facility] 05/09/2017 4:13 PM

## 2017-05-09 NOTE — Assessment & Plan Note (Signed)
History of coronary artery disease status post LAD diagonal branch bifurcation stenting November 2004 by myself. Unfortunately, he stopped his Plavix and presented 12/10/2003 with a large anterior wall Monocryl infarction related to acute stent thrombosis. He was studied urgently underwent thrombectomy and reactive intervention. His EF had dropped from 50% at the time of his initial catheterization done at 25% ultimately settled out to 35-40% by 2-D echo 12/22/08.. Myoview showed a large scar in the LAD territory. He has a symptomatic.

## 2017-05-09 NOTE — Assessment & Plan Note (Signed)
History of hyperlipidemia on statin therapy followed by his PCP 

## 2017-05-09 NOTE — Patient Instructions (Signed)
Medication Instructions: Your physician recommends that you continue on your current medications as directed. Please refer to the Current Medication list given to you today.  Labwork: Labwork will be requested from your primary care physician.   Follow-Up: Your physician wants you to follow-up in: 1 year with Dr. Gwenlyn Found. You will receive a reminder letter in the mail two months in advance. If you don't receive a letter, please call our office to schedule the follow-up appointment.  If you need a refill on your cardiac medications before your next appointment, please call your pharmacy.

## 2017-05-09 NOTE — Assessment & Plan Note (Signed)
History of essential hypertension blood pressure measured 134/82. He is on carvedilol and ramipril. Continue current meds at current dosing

## 2017-05-27 ENCOUNTER — Other Ambulatory Visit: Payer: Self-pay | Admitting: Cardiovascular Disease

## 2017-06-21 ENCOUNTER — Telehealth: Payer: Self-pay | Admitting: Cardiovascular Disease

## 2017-06-21 MED ORDER — NIACIN ER (ANTIHYPERLIPIDEMIC) 500 MG PO TBCR: 500.0000 mg | EXTENDED_RELEASE_TABLET | Freq: Every day | ORAL | 11 refills | Status: DC

## 2017-06-21 MED ORDER — ATORVASTATIN CALCIUM 40 MG PO TABS: 40.0000 mg | ORAL_TABLET | Freq: Every day | ORAL | 11 refills | Status: DC

## 2017-06-21 MED ORDER — RAMIPRIL 2.5 MG PO CAPS: 2.5000 mg | ORAL_CAPSULE | Freq: Every day | ORAL | 11 refills | Status: DC

## 2017-06-21 MED ORDER — CLOPIDOGREL BISULFATE 75 MG PO TABS: 75.0000 mg | ORAL_TABLET | Freq: Every day | ORAL | 11 refills | Status: DC

## 2017-06-21 MED ORDER — CARVEDILOL 6.25 MG PO TABS: 6.2500 mg | ORAL_TABLET | Freq: Two times a day (BID) | ORAL | 11 refills | Status: DC

## 2017-06-21 NOTE — Telephone Encounter (Signed)
Refill sent to the pharmacy electronically.  

## 2017-06-21 NOTE — Telephone Encounter (Signed)
°*  STAT* If patient is at the pharmacy, call can be transferred to refill team.   1. Which medications need to be refilled? (please list name of each medication and dose if known) Atorvastatin 40mg , CArvedilol 6.25 mg , Clopidgrel 75 mg , Niacin 500mg  and Rampril 2.5mg    2. Which pharmacy/location (including street and city if local pharmacy) is medication to be sent to?[facility] on [address]  3. Do they need a 30 day or 90 day supply? [place]

## 2018-03-30 ENCOUNTER — Other Ambulatory Visit: Payer: Self-pay | Admitting: Cardiovascular Disease

## 2018-04-27 ENCOUNTER — Other Ambulatory Visit: Payer: Self-pay | Admitting: Cardiovascular Disease

## 2018-04-29 NOTE — Telephone Encounter (Signed)
Rx sent to pharmacy   

## 2018-05-14 ENCOUNTER — Encounter: Payer: Self-pay | Admitting: Cardiovascular Disease

## 2018-05-14 ENCOUNTER — Ambulatory Visit: Payer: 59 | Admitting: Cardiovascular Disease

## 2018-05-14 VITALS — BP 138/80 | HR 64 | Ht 71.0 in | Wt 268.0 lb

## 2018-05-14 DIAGNOSIS — E7800 Pure hypercholesterolemia, unspecified: Secondary | ICD-10-CM

## 2018-05-14 DIAGNOSIS — I10 Essential (primary) hypertension: Secondary | ICD-10-CM | POA: Diagnosis not present

## 2018-05-14 DIAGNOSIS — I2510 Atherosclerotic heart disease of native coronary artery without angina pectoris: Secondary | ICD-10-CM | POA: Diagnosis not present

## 2018-05-14 DIAGNOSIS — I519 Heart disease, unspecified: Secondary | ICD-10-CM | POA: Diagnosis not present

## 2018-05-14 NOTE — Assessment & Plan Note (Signed)
History of essential hypertension her blood pressure measured today 138/80.  He is on carvedilol and ramipril.  Continue current meds at current dosing.

## 2018-05-14 NOTE — Patient Instructions (Signed)
Medication Instructions:  Your physician recommends that you continue on your current medications as directed. Please refer to the Current Medication list given to you today.   Labwork: none  Testing/Procedures: Your physician has requested that you have an echocardiogram. Echocardiography is a painless test that uses sound waves to create images of your heart. It provides your doctor with information about the size and shape of your heart and how well your heart's chambers and valves are working. This procedure takes approximately one hour. There are no restrictions for this procedure.    Follow-Up: Your physician wants you to follow-up in: 12 months with Dr. Berry. You will receive a reminder letter in the mail two months in advance. If you don't receive a letter, please call our office to schedule the follow-up appointment.   Any Other Special Instructions Will Be Listed Below (If Applicable).     If you need a refill on your cardiac medications before your next appointment, please call your pharmacy.   

## 2018-05-14 NOTE — Assessment & Plan Note (Signed)
History of hyperlipidemia on statin therapy. 

## 2018-05-14 NOTE — Progress Notes (Signed)
05/14/2018 Tyrone Haas   10-11-1956  [id number]  Primary Physician Raelyn Number, MD Primary Cardiologist: Lorretta Harp MD FACP, [place], [place], [place]  HPI:  Tyrone Haas is a 62 y.o.  mildly overweight separated Caucasian male, father of 4 children, who I last saw  05/09/2017.Marland Kitchen He has a history of CAD status post LAD and diagonal branch bifurcation stenting, November of 2004, by myself. 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 He was studied urgently and underwent thrombectomy and re-intervention. At his initial catheterization, EF was 50%, but subsequent to that, at the time of his second catheterization, his EF was 25%, and by echocardiogram 3 years ago EF had come up to 35% to 40%. Myoview showed a large scar in the LAD territory. He stopped smoking at that time. His other problems include hypertension and hyperlipidemia. He denies chest pain or shortness of breath     Current Meds  Medication Sig  . atorvastatin (LIPITOR) 40 MG tablet Take 1 tablet (40 mg total) by mouth daily at 6 PM.  . carvedilol (COREG) 6.25 MG tablet TAKE 1 TABLET (6.25 MG TOTAL) BY MOUTH 2 (TWO) TIMES DAILY WITH A MEAL.  Marland Kitchen clopidogrel (PLAVIX) 75 MG tablet Take 1 tablet (75 mg total) by mouth daily.  . niacin (NIASPAN) 500 MG CR tablet Take 1 tablet (500 mg total) by mouth at bedtime.  . ramipril (ALTACE) 2.5 MG capsule Take 1 capsule (2.5 mg total) by mouth daily.     No Known Allergies  Social History   Socioeconomic History  . Marital status: Divorced    Spouse name: Not on file  . Number of children: Not on file  . Years of education: Not on file  . Highest education level: Not on file  Occupational History  . Not on file  Social Needs  . Financial resource strain: Not on file  . Food insecurity:    Worry: Not on file    Inability: Not on file  . Transportation needs:      Medical: Not on file    Non-medical: Not on file  Tobacco Use  . Smoking status: Former Smoker    Last attempt to quit: 07/23/2009    Years since quitting: 8.8  . Smokeless tobacco: Never Used  Substance and Sexual Activity  . Alcohol use: Yes  . Drug use: No  . Sexual activity: Not on file  Lifestyle  . Physical activity:    Days per week: Not on file    Minutes per session: Not on file  . Stress: Not on file  Relationships  . Social connections:    Talks on phone: Not on file    Gets together: Not on file    Attends religious service: Not on file    Active member of club or organization: Not on file    Attends meetings of clubs or organizations: Not on file    Relationship status: Not on file  . Intimate partner violence:    Fear of current or ex partner: Not on file    Emotionally abused: Not on file    Physically abused: Not on file    Forced sexual activity: Not on file  Other Topics Concern  . Not on file  Social History Narrative  . Not on file     Review of Systems: General: negative for chills, fever, night sweats  or weight changes.  Cardiovascular: negative for chest pain, dyspnea on exertion, edema, orthopnea, palpitations, paroxysmal nocturnal dyspnea or shortness of breath Dermatological: negative for rash Respiratory: negative for cough or wheezing Urologic: negative for hematuria Abdominal: negative for nausea, vomiting, diarrhea, bright red blood per rectum, melena, or hematemesis Neurologic: negative for visual changes, syncope, or dizziness All other systems reviewed and are otherwise negative except as noted above.    Blood pressure 138/80, pulse 64, height 5\' 11"  (1.803 m), weight 268 lb (121.6 kg).  General appearance: alert and no distress Neck: no adenopathy, no carotid bruit, no JVD, supple, symmetrical, trachea midline and thyroid not enlarged, symmetric, no tenderness/mass/nodules Lungs: clear to auscultation bilaterally Heart: regular  rate and rhythm, S1, S2 normal, no murmur, click, rub or gallop Extremities: extremities normal, atraumatic, no cyanosis or edema Pulses: 2+ and symmetric Skin: Skin color, texture, turgor normal. No rashes or lesions Neurologic: Alert and oriented X 3, normal strength and tone. Normal symmetric reflexes. Normal coordination and gait  EKG sinus rhythm at 64 septal Q waves and lateral T wave inversion with low limb voltage.  I personally reviewed this EKG.  ASSESSMENT AND PLAN:   Coronary artery disease History of CAD status post LAD/diagonal branch stenting by myself November 2004.  Fortunately stopped his Plavix and presented December 10, 2003 with a large anterior wall microinfarction secondary to subacute stent thrombosis underwent emergent thrombectomy and re-intervention.  His initial EF was 50% and subsequent EF was 25% however this is not been rechecked.  Essential hypertension History of essential hypertension her blood pressure measured today 138/80.  He is on carvedilol and ramipril.  Continue current meds at current dosing.  Hyperlipidemia History of hyperlipidemia on statin therapy.      Lorretta Harp MD FACP,FACC,FAHA, [facility] 05/14/2018 9:20 AM

## 2018-05-14 NOTE — Assessment & Plan Note (Signed)
History of CAD status post LAD/diagonal branch stenting by myself November 2004.  Fortunately stopped his Plavix and presented December 10, 2003 with a large anterior wall microinfarction secondary to subacute stent thrombosis underwent emergent thrombectomy and re-intervention.  His initial EF was 50% and subsequent EF was 25% however this is not been rechecked.

## 2018-05-16 ENCOUNTER — Ambulatory Visit (HOSPITAL_COMMUNITY): Payer: 59 | Attending: Cardiology

## 2018-05-16 ENCOUNTER — Other Ambulatory Visit: Payer: Self-pay

## 2018-05-16 DIAGNOSIS — I10 Essential (primary) hypertension: Secondary | ICD-10-CM | POA: Diagnosis not present

## 2018-05-16 DIAGNOSIS — I119 Hypertensive heart disease without heart failure: Secondary | ICD-10-CM | POA: Diagnosis not present

## 2018-05-16 DIAGNOSIS — E669 Obesity, unspecified: Secondary | ICD-10-CM | POA: Diagnosis not present

## 2018-05-16 DIAGNOSIS — E7800 Pure hypercholesterolemia, unspecified: Secondary | ICD-10-CM | POA: Diagnosis not present

## 2018-05-16 DIAGNOSIS — Z6837 Body mass index (BMI) 37.0-37.9, adult: Secondary | ICD-10-CM | POA: Insufficient documentation

## 2018-05-16 DIAGNOSIS — I2510 Atherosclerotic heart disease of native coronary artery without angina pectoris: Secondary | ICD-10-CM | POA: Diagnosis not present

## 2018-05-16 DIAGNOSIS — I340 Nonrheumatic mitral (valve) insufficiency: Secondary | ICD-10-CM | POA: Insufficient documentation

## 2018-05-16 DIAGNOSIS — I519 Heart disease, unspecified: Secondary | ICD-10-CM | POA: Diagnosis present

## 2018-05-16 MED ORDER — PERFLUTREN LIPID MICROSPHERE
1.0000 mL | INTRAVENOUS | Status: AC | PRN
  Administered 2018-05-16: 3 mL via INTRAVENOUS

## 2018-05-20 ENCOUNTER — Encounter: Payer: Self-pay | Admitting: *Deleted

## 2018-05-20 NOTE — Telephone Encounter (Addendum)
Left message for pt to call    ----- Message from Lorretta Harp, MD sent at 05/17/2018  2:10 PM EDT ----- Moderate LV enlargement, EF 30 to 35% with moderate MR.  Needs return office visit to discuss

## 2018-05-25 ENCOUNTER — Other Ambulatory Visit: Payer: Self-pay | Admitting: Cardiovascular Disease

## 2018-05-29 NOTE — Telephone Encounter (Signed)
This encounter was created in error - please disregard.

## 2018-06-22 ENCOUNTER — Other Ambulatory Visit: Payer: Self-pay | Admitting: Cardiovascular Disease

## 2018-06-23 ENCOUNTER — Other Ambulatory Visit: Payer: Self-pay | Admitting: Cardiovascular Disease

## 2018-06-26 ENCOUNTER — Ambulatory Visit (INDEPENDENT_AMBULATORY_CARE_PROVIDER_SITE_OTHER): Payer: 59 | Admitting: Cardiovascular Disease

## 2018-06-26 ENCOUNTER — Encounter: Payer: Self-pay | Admitting: Cardiovascular Disease

## 2018-06-26 DIAGNOSIS — I255 Ischemic cardiomyopathy: Secondary | ICD-10-CM

## 2018-06-26 NOTE — Patient Instructions (Signed)
Medication Instructions:  Your physician recommends that you continue on your current medications as directed. Please refer to the Current Medication list given to you today.   Labwork: none  Testing/Procedures: none  Follow-Up: You have been referred to EP - Dr. Loletha Grayer.    Your physician wants you to follow-up in: 12 months with Dr. Gwenlyn Found. You will receive a reminder letter in the mail two months in advance. If you don't receive a letter, please call our office to schedule the follow-up appointment.   Any Other Special Instructions Will Be Listed Below (If Applicable).     If you need a refill on your cardiac medications before your next appointment, please call your pharmacy.

## 2018-06-26 NOTE — Addendum Note (Signed)
Addended by: Newt Minion on: 06/26/2018 05:05 PM   Modules accepted: Orders

## 2018-06-26 NOTE — Assessment & Plan Note (Signed)
Tyrone Haas returns today for follow-up of his 2D echo which was performed 05/16/2018.  He is completely asymptomatic although his echo showed EF of 30 to 35%.  I am going to refer him to Dr. Sallyanne Kuster for discussion and consideration of implantation of ICD for primary prevention.

## 2018-06-26 NOTE — Progress Notes (Signed)
Mr. Juhnke returns today for follow-up of his 2D echo which was performed 05/16/2018.  He is completely asymptomatic although his echo showed EF of 30 to 35%.  I am going to refer him to Dr. Sallyanne Kuster for discussion and consideration of implantation of ICD for primary prevention.  Lorretta Harp, M.D., [place], [facility], [place] [address] [place], [place]  [address]  [phone] 06/26/2018 5:02 PM

## 2018-07-25 ENCOUNTER — Other Ambulatory Visit: Payer: Self-pay | Admitting: Cardiovascular Disease

## 2018-08-20 ENCOUNTER — Ambulatory Visit: Payer: 59 | Admitting: Cardiovascular Disease

## 2018-09-19 ENCOUNTER — Encounter: Payer: Self-pay | Admitting: Cardiovascular Disease

## 2018-09-19 ENCOUNTER — Ambulatory Visit: Payer: 59 | Admitting: Cardiovascular Disease

## 2018-09-19 VITALS — BP 132/86 | HR 63 | Ht 71.0 in | Wt 267.6 lb

## 2018-09-19 DIAGNOSIS — I255 Ischemic cardiomyopathy: Secondary | ICD-10-CM

## 2018-09-19 DIAGNOSIS — E7800 Pure hypercholesterolemia, unspecified: Secondary | ICD-10-CM | POA: Diagnosis not present

## 2018-09-19 DIAGNOSIS — I10 Essential (primary) hypertension: Secondary | ICD-10-CM

## 2018-09-19 DIAGNOSIS — Z9189 Other specified personal risk factors, not elsewhere classified: Secondary | ICD-10-CM | POA: Diagnosis not present

## 2018-09-19 DIAGNOSIS — I2510 Atherosclerotic heart disease of native coronary artery without angina pectoris: Secondary | ICD-10-CM

## 2018-09-19 DIAGNOSIS — I5022 Chronic systolic (congestive) heart failure: Secondary | ICD-10-CM

## 2018-09-19 NOTE — Progress Notes (Signed)
Cardiology Office Note:    Date:  09/22/2018   ID:  Tyrone Haas, DOB [birth year], MRN [id number]  PCP:  Tyrone Number, MD  Cardiologist:  No primary care provider on file.  Electrophysiologist:  None   Referring MD: Tyrone Number, MD   Chief Complaint  Patient presents with  . Coronary Artery Disease    Discuss AICD  . Congestive Heart Failure    History of Present Illness:    Tyrone Haas is a 62 y.o. male with a hx of CAD, history of previous extensive anterior myocardial infarction with persistent moderate to severe left ventricular systolic dysfunction with ejection fraction of 30-35% despite medical therapy with maximum tolerated doses of carvedilol and ramipril, referred by Dr. Gwenlyn Haas to discuss defibrillator implantation.  He has well compensated congestive heart failure, NYHA functional class II.  He is able to ride horses.  He has problems with shortness of breath bending over but does not have leg edema, orthopnea, PND or angina at rest or with activity.  He denies syncope or palpitations.  He does not have known problems with ventricular arrhythmia.  His initial presentation CAD led to implantation of stents in the LAD and diagonal branch in November 2004 but he stopped his clopidogrel prematurely and presented with acute stent thrombosis and a large anterior wall infarction in February 2005.  He underwent thrombectomy and re-intervention.  Nuclear stress testing has documented a large scar in the anterior wall.  EF has been documented as low as 25%.   His most recent echocardiogram from August 2019 documents an ejection fraction of 30-35%.  There was moderate mitral regurgitation and severe left atrial enlargement.  Diastolic parameters suggested that he had volume overload.  PA pressure was reportedly normal at 22.  Additional medical problems include hypertension, hyperlipidemia and severe obesity.  Past Medical History:  Diagnosis Date  . CAD (coronary artery  disease)   . History of tobacco abuse    Heavy tobacco use  . Hyperlipidemia   . Hypertension   . Left ventricular dysfunction   . Mild renal insufficiency   . Nephrolithiasis   . Noncompliance     Past Surgical History:  Procedure Laterality Date  . CARDIAC CATHETERIZATION  November 2004  . OTHER SURGICAL HISTORY     Prior ear surgery  . TONSILLECTOMY      Current Medications: Current Meds  Medication Sig  . atorvastatin (LIPITOR) 40 MG tablet TAKE 1 TABLET (40 MG TOTAL) BY MOUTH DAILY AT 6 PM.  . carvedilol (COREG) 6.25 MG tablet TAKE 1 TABLET BY MOUTH TWICE A DAY WITH MEALS  . clopidogrel (PLAVIX) 75 MG tablet TAKE 1 TABLET BY MOUTH EVERY DAY  . NIASPAN 500 MG CR tablet TAKE 1 TABLET (500 MG TOTAL) BY MOUTH AT BEDTIME.  . ramipril (ALTACE) 2.5 MG capsule TAKE 1 CAPSULE (2.5 MG TOTAL) BY MOUTH DAILY.     Allergies:   Patient has no known allergies.   Social History   Socioeconomic History  . Marital status: Divorced    Spouse name: Not on file  . Haas of children: Not on file  . Years of education: Not on file  . Highest education level: Not on file  Occupational History  . Not on file  Social Needs  . Financial resource strain: Not on file  . Food insecurity:    Worry: Not on file    Inability: Not on file  . Transportation needs:    Medical: Not  on file    Non-medical: Not on file  Tobacco Use  . Smoking status: Former Smoker    Last attempt to quit: 07/23/2009    Years since quitting: 9.1  . Smokeless tobacco: Never Used  Substance and Sexual Activity  . Alcohol use: Yes  . Drug use: No  . Sexual activity: Not on file  Lifestyle  . Physical activity:    Days per week: Not on file    Minutes per session: Not on file  . Stress: Not on file  Relationships  . Social connections:    Talks on phone: Not on file    Gets together: Not on file    Attends religious service: Not on file    Active member of club or organization: Not on file    Attends  meetings of clubs or organizations: Not on file    Relationship status: Not on file  Other Topics Concern  . Not on file  Social History Narrative  . Not on file     Family History: The patient's family history includes Cancer in his mother; Coronary artery disease in his father; Diabetes in his father; Hypertension in his mother; Stroke in his father.  ROS:   Please see the history of present illness.     All other systems reviewed and are negative.  EKGs/Labs/Other Studies Reviewed:    The following studies were reviewed today: Echo from August 2019, notes from Tyrone Haas  EKG:  EKG is ordered today.  The ekg ordered today demonstrates normal sinus rhythm, sequelae of old anterolateral myocardial infarction, no acute repolarization abnormalities, QTC 390 ms  Recent Labs: No results Haas for requested labs within last 8760 hours.  Recent Lipid Panel No results Haas for: CHOL, TRIG, HDL, CHOLHDL, VLDL, LDLCALC, LDLDIRECT  Physical Exam:    VS:  BP 132/86   Pulse 63   Ht 5\' 11"  (1.803 m)   Wt 267 lb 9.6 oz (121.4 kg)   BMI 37.32 kg/m     Wt Readings from Last 3 Encounters:  09/19/18 267 lb 9.6 oz (121.4 kg)  06/26/18 263 lb 6.4 oz (119.5 kg)  05/14/18 268 lb (121.6 kg)     GEN: Severely obese, well nourished, well developed in no acute distress HEENT: Normal NECK: No JVD; No carotid bruits LYMPHATICS: No lymphadenopathy CARDIAC: RRR, no murmurs, rubs, gallops RESPIRATORY:  Clear to auscultation without rales, wheezing or rhonchi  ABDOMEN: Soft, non-tender, non-distended MUSCULOSKELETAL:  No edema; No deformity  SKIN: Warm and dry NEUROLOGIC:  Alert and oriented x 3 PSYCHIATRIC:  Normal affect   ASSESSMENT:    1. At risk for sudden cardiac death   2. Chronic systolic heart failure ([place])   3. Essential hypertension   4. Hypercholesterolemia   5. Coronary artery disease involving native coronary artery of native heart without angina pectoris   6. Ischemic  cardiomyopathy    PLAN:    In order of problems listed above:  1. ICD: [name] meets criteria for primary prevention ICD implantation for ischemic cardiomyopathy (Prior myocardial infarction, left ventricular ejection fraction under 35%, heart failure NYHA class II, on comprehensive medical therapy>90 days).  We discussed the procedure in detail including technical issues, periprocedural complications (including pneumothorax, perforation, lead dislodgment, infection, need for reoperation, amongst others), the purpose of the device and its limitations, possible unnecessary shocks.  He was particular interested in confirming that he could still drive a car and lead courses would be defibrillator, once the site had  healed.  He was also very preoccupied by the cost of the device and to make sure that his out-of-pocket cost would be acceptable.  I directed those questions to our billing department and insurance company.This procedure has been fully reviewed with the patient and written informed consent has been obtained. 2. CHF: Appears euvolemic, NYHA functional class II, on beta-blocker and angiotensin receptor blocker but not requiring diuretics 3. HTN: Fair control. 4. HLP: On statin and Niaspan, no recent lipid profile available for review. 5. CAD: Status post extensive anterior wall myocardial infarction, on chronic clopidogrel therapy, asymptomatic.   Medication Adjustments/Labs and Tests Ordered: Current medicines are reviewed at length with the patient today.  Concerns regarding medicines are outlined above.  Orders Placed This Encounter  Procedures  . Basic metabolic panel  . CBC  . INR/PT  . EKG 12-Lead   No orders of the defined types were placed in this encounter.   There are no Patient Instructions on file for this visit.   Signed, Sanda Klein, MD  09/22/2018 11:48 AM    [place]

## 2018-09-19 NOTE — H&P (View-Only) (Signed)
Cardiology Office Note:    Date:  09/22/2018   ID:  Tyrone Haas, DOB September 24, 1956, MRN [id number]  PCP:  Raelyn Number, MD  Cardiologist:  No primary care provider on file.  Electrophysiologist:  None   Referring MD: Raelyn Number, MD   Chief Complaint  Patient presents with  . Coronary Artery Disease    Discuss AICD  . Congestive Heart Failure    History of Present Illness:    Tyrone Haas is a 62 y.o. male with a hx of CAD, history of previous extensive anterior myocardial infarction with persistent moderate to severe left ventricular systolic dysfunction with ejection fraction of 30-35% despite medical therapy with maximum tolerated doses of carvedilol and ramipril, referred by Dr. Gwenlyn Found to discuss defibrillator implantation.  He has well compensated congestive heart failure, NYHA functional class II.  He is able to ride horses.  He has problems with shortness of breath bending over but does not have leg edema, orthopnea, PND or angina at rest or with activity.  He denies syncope or palpitations.  He does not have known problems with ventricular arrhythmia.  His initial presentation CAD led to implantation of stents in the LAD and diagonal branch in November 2004 but he stopped his clopidogrel prematurely and presented with acute stent thrombosis and a large anterior wall infarction in February 2005.  He underwent thrombectomy and re-intervention.  Nuclear stress testing has documented a large scar in the anterior wall.  EF has been documented as low as 25%.   His most recent echocardiogram from August 2019 documents an ejection fraction of 30-35%.  There was moderate mitral regurgitation and severe left atrial enlargement.  Diastolic parameters suggested that he had volume overload.  PA pressure was reportedly normal at 22.  Additional medical problems include hypertension, hyperlipidemia and severe obesity.  Past Medical History:  Diagnosis Date  . CAD (coronary artery  disease)   . History of tobacco abuse    Heavy tobacco use  . Hyperlipidemia   . Hypertension   . Left ventricular dysfunction   . Mild renal insufficiency   . Nephrolithiasis   . Noncompliance     Past Surgical History:  Procedure Laterality Date  . CARDIAC CATHETERIZATION  November 2004  . OTHER SURGICAL HISTORY     Prior ear surgery  . TONSILLECTOMY      Current Medications: Current Meds  Medication Sig  . atorvastatin (LIPITOR) 40 MG tablet TAKE 1 TABLET (40 MG TOTAL) BY MOUTH DAILY AT 6 PM.  . carvedilol (COREG) 6.25 MG tablet TAKE 1 TABLET BY MOUTH TWICE A DAY WITH MEALS  . clopidogrel (PLAVIX) 75 MG tablet TAKE 1 TABLET BY MOUTH EVERY DAY  . NIASPAN 500 MG CR tablet TAKE 1 TABLET (500 MG TOTAL) BY MOUTH AT BEDTIME.  . ramipril (ALTACE) 2.5 MG capsule TAKE 1 CAPSULE (2.5 MG TOTAL) BY MOUTH DAILY.     Allergies:   Patient has no known allergies.   Social History   Socioeconomic History  . Marital status: Divorced    Spouse name: Not on file  . Number of children: Not on file  . Years of education: Not on file  . Highest education level: Not on file  Occupational History  . Not on file  Social Needs  . Financial resource strain: Not on file  . Food insecurity:    Worry: Not on file    Inability: Not on file  . Transportation needs:    Medical: Not  on file    Non-medical: Not on file  Tobacco Use  . Smoking status: Former Smoker    Last attempt to quit: 07/23/2009    Years since quitting: 9.1  . Smokeless tobacco: Never Used  Substance and Sexual Activity  . Alcohol use: Yes  . Drug use: No  . Sexual activity: Not on file  Lifestyle  . Physical activity:    Days per week: Not on file    Minutes per session: Not on file  . Stress: Not on file  Relationships  . Social connections:    Talks on phone: Not on file    Gets together: Not on file    Attends religious service: Not on file    Active member of club or organization: Not on file    Attends  meetings of clubs or organizations: Not on file    Relationship status: Not on file  Other Topics Concern  . Not on file  Social History Narrative  . Not on file     Family History: The patient's family history includes Cancer in his mother; Coronary artery disease in his father; Diabetes in his father; Hypertension in his mother; Stroke in his father.  ROS:   Please see the history of present illness.     All other systems reviewed and are negative.  EKGs/Labs/Other Studies Reviewed:    The following studies were reviewed today: Echo from August 2019, notes from Dr. Alvester Chou  EKG:  EKG is ordered today.  The ekg ordered today demonstrates normal sinus rhythm, sequelae of old anterolateral myocardial infarction, no acute repolarization abnormalities, QTC 390 ms  Recent Labs: No results found for requested labs within last 8760 hours.  Recent Lipid Panel No results found for: CHOL, TRIG, HDL, CHOLHDL, VLDL, LDLCALC, LDLDIRECT  Physical Exam:    VS:  BP 132/86   Pulse 63   Ht 5\' 11"  (1.803 m)   Wt 267 lb 9.6 oz (121.4 kg)   BMI 37.32 kg/m     Wt Readings from Last 3 Encounters:  09/19/18 267 lb 9.6 oz (121.4 kg)  06/26/18 263 lb 6.4 oz (119.5 kg)  05/14/18 268 lb (121.6 kg)     GEN: Severely obese, well nourished, well developed in no acute distress HEENT: Normal NECK: No JVD; No carotid bruits LYMPHATICS: No lymphadenopathy CARDIAC: RRR, no murmurs, rubs, gallops RESPIRATORY:  Clear to auscultation without rales, wheezing or rhonchi  ABDOMEN: Soft, non-tender, non-distended MUSCULOSKELETAL:  No edema; No deformity  SKIN: Warm and dry NEUROLOGIC:  Alert and oriented x 3 PSYCHIATRIC:  Normal affect   ASSESSMENT:    1. At risk for sudden cardiac death   2. Chronic systolic heart failure ([place])   3. Essential hypertension   4. Hypercholesterolemia   5. Coronary artery disease involving native coronary artery of native heart without angina pectoris   6. Ischemic  cardiomyopathy    PLAN:    In order of problems listed above:  1. ICD: [name] meets criteria for primary prevention ICD implantation for ischemic cardiomyopathy (Prior myocardial infarction, left ventricular ejection fraction under 35%, heart failure NYHA class II, on comprehensive medical therapy>90 days).  We discussed the procedure in detail including technical issues, periprocedural complications (including pneumothorax, perforation, lead dislodgment, infection, need for reoperation, amongst others), the purpose of the device and its limitations, possible unnecessary shocks.  He was particular interested in confirming that he could still drive a car and lead courses would be defibrillator, once the site had  healed.  He was also very preoccupied by the cost of the device and to make sure that his out-of-pocket cost would be acceptable.  I directed those questions to our billing department and insurance company.This procedure has been fully reviewed with the patient and written informed consent has been obtained. 2. CHF: Appears euvolemic, NYHA functional class II, on beta-blocker and angiotensin receptor blocker but not requiring diuretics 3. HTN: Fair control. 4. HLP: On statin and Niaspan, no recent lipid profile available for review. 5. CAD: Status post extensive anterior wall myocardial infarction, on chronic clopidogrel therapy, asymptomatic.   Medication Adjustments/Labs and Tests Ordered: Current medicines are reviewed at length with the patient today.  Concerns regarding medicines are outlined above.  Orders Placed This Encounter  Procedures  . Basic metabolic panel  . CBC  . INR/PT  . EKG 12-Lead   No orders of the defined types were placed in this encounter.   There are no Patient Instructions on file for this visit.   Signed, Sanda Klein, MD  09/22/2018 11:48 AM    [place]

## 2018-09-22 ENCOUNTER — Encounter: Payer: Self-pay | Admitting: Cardiovascular Disease

## 2018-09-22 DIAGNOSIS — I5022 Chronic systolic (congestive) heart failure: Secondary | ICD-10-CM | POA: Insufficient documentation

## 2018-09-30 ENCOUNTER — Telehealth: Payer: Self-pay | Admitting: Cardiovascular Disease

## 2018-09-30 NOTE — Telephone Encounter (Signed)
Follow up:   Patient girlfriend calling.  She states she has not heard from anyone yet. Please call back.

## 2018-09-30 NOTE — Telephone Encounter (Signed)
° ° °  Murlean Iba (girlfriend) calling to schedule ICD implantation

## 2018-09-30 NOTE — Telephone Encounter (Signed)
Returned call to patient's girlfriend Mardene Celeste she stated patient wanted to have ICD before the end of year.Message sent to Dr.Croitoru's CMA.

## 2018-09-30 NOTE — Telephone Encounter (Signed)
Returned call to patient's girlfriend Tyrone Haas advised [name] in clinic today.I will send message to her you want to be called back today about scheduling ICD before end of year.

## 2018-09-30 NOTE — Telephone Encounter (Signed)
Pt scheduled for 10/14/18 at 3:30p. Pt will present for labs on Wednesday 10/02/18 at 2:00p.

## 2018-10-03 LAB — BASIC METABOLIC PANEL
BUN/Creatinine Ratio: 21 (ref 10–24)
BUN: 19 mg/dL (ref 8–27)
CO2: 23 mmol/L (ref 20–29)
Calcium: 9.9 mg/dL (ref 8.6–10.2)
Chloride: 100 mmol/L (ref 96–106)
Creatinine, Ser: 0.9 mg/dL (ref 0.76–1.27)
GFR calc Af Amer: 105 mL/min/{1.73_m2} (ref >59)
GFR calc non Af Amer: 91 mL/min/{1.73_m2} (ref >59)
Glucose: 146 mg/dL — ABNORMAL HIGH (ref 65–99)
Potassium: 4.5 mmol/L (ref 3.5–5.2)
Sodium: 138 mmol/L (ref 134–144)

## 2018-10-03 LAB — CBC
Hematocrit: 47.1 % (ref 37.5–51.0)
Hemoglobin: 16.3 g/dL (ref 13.0–17.7)
MCH: 31.4 pg (ref 26.6–33.0)
MCHC: 34.6 g/dL (ref 31.5–35.7)
MCV: 91 fL (ref 79–97)
Platelets: 211 10*3/uL (ref 150–450)
RBC: 5.19 x10E6/uL (ref 4.14–5.80)
RDW: 12.3 % (ref 12.3–15.4)
WBC: 8.9 10*3/uL (ref 3.4–10.8)

## 2018-10-03 LAB — PROTIME-INR
INR: 1 (ref 0.8–1.2)
Prothrombin Time: 10.8 s (ref 9.1–12.0)

## 2018-10-14 ENCOUNTER — Encounter (HOSPITAL_COMMUNITY): Payer: Self-pay | Admitting: General Practice

## 2018-10-14 ENCOUNTER — Ambulatory Visit (HOSPITAL_COMMUNITY)
Admission: RE | Disposition: A | Discharge status: Home / Self Care | Payer: Self-pay | Source: Home / Self Care | Attending: Cardiovascular Disease

## 2018-10-14 ENCOUNTER — Other Ambulatory Visit: Payer: Self-pay

## 2018-10-14 ENCOUNTER — Ambulatory Visit (HOSPITAL_COMMUNITY)
Admission: RE | Admit: 2018-10-14 | Discharge: 2018-10-15 | Disposition: A | Discharge status: Home / Self Care | Payer: 59 | Attending: Cardiovascular Disease | Admitting: Cardiovascular Disease

## 2018-10-14 DIAGNOSIS — I110 Hypertensive heart disease with heart failure: Secondary | ICD-10-CM | POA: Insufficient documentation

## 2018-10-14 DIAGNOSIS — R0989 Other specified symptoms and signs involving the circulatory and respiratory systems: Secondary | ICD-10-CM | POA: Insufficient documentation

## 2018-10-14 DIAGNOSIS — Z006 Encounter for examination for normal comparison and control in clinical research program: Secondary | ICD-10-CM | POA: Diagnosis not present

## 2018-10-14 DIAGNOSIS — Z823 Family history of stroke: Secondary | ICD-10-CM | POA: Diagnosis not present

## 2018-10-14 DIAGNOSIS — I252 Old myocardial infarction: Secondary | ICD-10-CM | POA: Diagnosis not present

## 2018-10-14 DIAGNOSIS — Z7902 Long term (current) use of antithrombotics/antiplatelets: Secondary | ICD-10-CM | POA: Diagnosis not present

## 2018-10-14 DIAGNOSIS — I5022 Chronic systolic (congestive) heart failure: Secondary | ICD-10-CM | POA: Diagnosis present

## 2018-10-14 DIAGNOSIS — I2510 Atherosclerotic heart disease of native coronary artery without angina pectoris: Secondary | ICD-10-CM | POA: Insufficient documentation

## 2018-10-14 DIAGNOSIS — Z6837 Body mass index (BMI) 37.0-37.9, adult: Secondary | ICD-10-CM | POA: Diagnosis not present

## 2018-10-14 DIAGNOSIS — E7800 Pure hypercholesterolemia, unspecified: Secondary | ICD-10-CM | POA: Insufficient documentation

## 2018-10-14 DIAGNOSIS — J9811 Atelectasis: Secondary | ICD-10-CM | POA: Diagnosis not present

## 2018-10-14 DIAGNOSIS — Z9189 Other specified personal risk factors, not elsewhere classified: Secondary | ICD-10-CM

## 2018-10-14 DIAGNOSIS — Z8249 Family history of ischemic heart disease and other diseases of the circulatory system: Secondary | ICD-10-CM | POA: Diagnosis not present

## 2018-10-14 DIAGNOSIS — I051 Rheumatic mitral insufficiency: Secondary | ICD-10-CM | POA: Insufficient documentation

## 2018-10-14 DIAGNOSIS — Z79899 Other long term (current) drug therapy: Secondary | ICD-10-CM | POA: Insufficient documentation

## 2018-10-14 DIAGNOSIS — I255 Ischemic cardiomyopathy: Secondary | ICD-10-CM | POA: Insufficient documentation

## 2018-10-14 DIAGNOSIS — Z955 Presence of coronary angioplasty implant and graft: Secondary | ICD-10-CM | POA: Diagnosis not present

## 2018-10-14 DIAGNOSIS — Z95810 Presence of automatic (implantable) cardiac defibrillator: Secondary | ICD-10-CM | POA: Diagnosis present

## 2018-10-14 DIAGNOSIS — Z87891 Personal history of nicotine dependence: Secondary | ICD-10-CM | POA: Diagnosis not present

## 2018-10-14 HISTORY — DX: Chronic obstructive pulmonary disease, unspecified: J44.9

## 2018-10-14 HISTORY — DX: ST elevation (STEMI) myocardial infarction involving other coronary artery of anterior wall: I21.09

## 2018-10-14 HISTORY — DX: Presence of automatic (implantable) cardiac defibrillator: Z95.810

## 2018-10-14 HISTORY — PX: ICD IMPLANT: EP1208

## 2018-10-14 HISTORY — PX: INSERTION OF ICD: SHX6689

## 2018-10-14 HISTORY — DX: Personal history of urinary calculi: Z87.442

## 2018-10-14 LAB — SURGICAL PCR SCREEN
MRSA, PCR: NEGATIVE
Staphylococcus aureus: NEGATIVE

## 2018-10-14 SURGERY — ICD IMPLANT

## 2018-10-14 MED ORDER — ACETAMINOPHEN 325 MG PO TABS
325.0000 mg | ORAL_TABLET | ORAL | Status: DC | PRN
  Administered 2018-10-15 (×2): 650 mg via ORAL
  Filled 2018-10-14 (×2): qty 2

## 2018-10-14 MED ORDER — SODIUM CHLORIDE 0.9 % IV SOLN
INTRAVENOUS | Status: AC
  Filled 2018-10-14: qty 2

## 2018-10-14 MED ORDER — ONDANSETRON HCL 4 MG/2ML IJ SOLN: 4.0000 mg | Freq: Four times a day (QID) | INTRAMUSCULAR | Status: DC | PRN

## 2018-10-14 MED ORDER — THE SENSUOUS HEART BOOK
Freq: Once | Status: AC
  Administered 2018-10-14: 21:00:00
  Filled 2018-10-14: qty 1

## 2018-10-14 MED ORDER — CEFAZOLIN SODIUM-DEXTROSE 1-4 GM/50ML-% IV SOLN
1.0000 g | Freq: Four times a day (QID) | INTRAVENOUS | Status: AC
  Administered 2018-10-14 – 2018-10-15 (×3): 1 g via INTRAVENOUS
  Filled 2018-10-14 (×3): qty 50

## 2018-10-14 MED ORDER — CLOPIDOGREL BISULFATE 75 MG PO TABS
75.0000 mg | ORAL_TABLET | Freq: Every day | ORAL | Status: DC
  Administered 2018-10-15: 75 mg via ORAL
  Filled 2018-10-14: qty 1

## 2018-10-14 MED ORDER — ATORVASTATIN CALCIUM 40 MG PO TABS
40.0000 mg | ORAL_TABLET | Freq: Every day | ORAL | Status: DC
  Administered 2018-10-14: 18:00:00 40 mg via ORAL
  Filled 2018-10-14: qty 1

## 2018-10-14 MED ORDER — LIDOCAINE HCL (PF) 1 % IJ SOLN
INTRAMUSCULAR | Status: DC | PRN
  Administered 2018-10-14: 60 mL

## 2018-10-14 MED ORDER — SODIUM CHLORIDE 0.9 % IV SOLN
INTRAVENOUS | Status: DC
  Administered 2018-10-14 (×2): via INTRAVENOUS

## 2018-10-14 MED ORDER — SODIUM CHLORIDE 0.9 % IV SOLN
80.0000 mg | INTRAVENOUS | Status: AC
  Administered 2018-10-14: 80 mg

## 2018-10-14 MED ORDER — HEPARIN (PORCINE) IN NACL 1000-0.9 UT/500ML-% IV SOLN
INTRAVENOUS | Status: AC
  Filled 2018-10-14: qty 500

## 2018-10-14 MED ORDER — NIACIN ER (ANTIHYPERLIPIDEMIC) 500 MG PO TBCR
500.0000 mg | EXTENDED_RELEASE_TABLET | Freq: Every day | ORAL | Status: DC
  Administered 2018-10-14: 21:00:00 500 mg via ORAL
  Filled 2018-10-14 (×2): qty 1

## 2018-10-14 MED ORDER — CEFAZOLIN SODIUM-DEXTROSE 2-4 GM/100ML-% IV SOLN
INTRAVENOUS | Status: AC
  Filled 2018-10-14: qty 100

## 2018-10-14 MED ORDER — YOU HAVE A PACEMAKER BOOK
Freq: Once | Status: AC
  Administered 2018-10-14: 21:00:00
  Filled 2018-10-14: qty 1

## 2018-10-14 MED ORDER — FENTANYL CITRATE (PF) 100 MCG/2ML IJ SOLN
INTRAMUSCULAR | Status: AC
  Filled 2018-10-14: qty 2

## 2018-10-14 MED ORDER — SODIUM CHLORIDE 0.9 % IV SOLN
INTRAVENOUS | Status: DC
  Administered 2018-10-14: 14:00:00 via INTRAVENOUS

## 2018-10-14 MED ORDER — CARVEDILOL 3.125 MG PO TABS
6.2500 mg | ORAL_TABLET | Freq: Two times a day (BID) | ORAL | Status: DC
  Administered 2018-10-14 – 2018-10-15 (×2): 6.25 mg via ORAL
  Filled 2018-10-14 (×2): qty 2

## 2018-10-14 MED ORDER — LIDOCAINE HCL (PF) 1 % IJ SOLN
INTRAMUSCULAR | Status: AC
  Filled 2018-10-14: qty 60

## 2018-10-14 MED ORDER — HEPARIN (PORCINE) IN NACL 2-0.9 UNITS/ML
INTRAMUSCULAR | Status: AC | PRN
  Administered 2018-10-14: 500 mL

## 2018-10-14 MED ORDER — MUPIROCIN 2 % EX OINT
TOPICAL_OINTMENT | CUTANEOUS | Status: AC
  Administered 2018-10-14: 1
  Filled 2018-10-14: qty 22

## 2018-10-14 MED ORDER — MIDAZOLAM HCL 5 MG/5ML IJ SOLN
INTRAMUSCULAR | Status: AC
  Filled 2018-10-14: qty 5

## 2018-10-14 MED ORDER — UMECLIDINIUM-VILANTEROL 62.5-25 MCG/INH IN AEPB
1.0000 | INHALATION_SPRAY | Freq: Every day | RESPIRATORY_TRACT | Status: DC
  Filled 2018-10-14: qty 14

## 2018-10-14 MED ORDER — CEFAZOLIN SODIUM-DEXTROSE 2-4 GM/100ML-% IV SOLN
2.0000 g | INTRAVENOUS | Status: AC
  Administered 2018-10-14: 2 g via INTRAVENOUS

## 2018-10-14 MED ORDER — RAMIPRIL 2.5 MG PO CAPS
2.5000 mg | ORAL_CAPSULE | Freq: Every day | ORAL | Status: DC
  Administered 2018-10-15: 09:00:00 2.5 mg via ORAL
  Filled 2018-10-14: qty 1

## 2018-10-14 MED ORDER — CHLORHEXIDINE GLUCONATE 4 % EX LIQD: 60.0000 mL | Freq: Once | CUTANEOUS | Status: DC

## 2018-10-14 SURGICAL SUPPLY — 7 items
CABLE SURGICAL S-101-97-12 (CABLE) ×2 IMPLANT
ICD VISIA MRI VR DVFB1D4 (ICD Generator) ×1 IMPLANT
LEAD SPRINT QUAT SEC 6935M-62 (Lead) ×2 IMPLANT
PAD PRO RADIOLUCENT 2001M-C (PAD) ×2 IMPLANT
SHEATH CLASSIC 9F (SHEATH) ×2 IMPLANT
TRAY PACEMAKER INSERTION (PACKS) ×2 IMPLANT
VISIA MRI VR DVFB1D4 (ICD Generator) ×2 IMPLANT

## 2018-10-14 NOTE — Interval H&P Note (Signed)
History and Physical Interval Note:  10/14/2018 1:55 PM  [facility] [place]  has presented today for surgery, with the diagnosis of ischemic cardiomyopathy.  The various methods of treatment have been discussed with the patient and family. After consideration of risks, benefits and other options for treatment, the patient has consented to  Procedure(s): ICD IMPLANT (N/A) as a surgical intervention .  The patient's history has been reviewed, patient examined, no change in status, stable for surgery.  I have reviewed the patient's chart and labs.  Questions were answered to the patient's satisfaction.     Mahalie Kanner

## 2018-10-14 NOTE — Op Note (Signed)
Procedure report  Procedure performed: 1. Implantation of new single chamber cardioverter defibrillator 2. Fluoroscopy 3.  Initial lead and generator testing  Reason for procedure: Primary prevention of sudden cardiac death Ischemic cardiomyopathy, left ventricular ejection fraction less than 30%, Heart failure NYHA class 2, on comprehensive medical therapy for over 90 days (MADIT-II)  Procedure performed by: Sanda Klein, MD  Complications: None  Estimated blood loss: <10 mL  Medications administered during procedure: Ancef 2g intravenously Lidocaine 1% 30 mL locally,   Device details: Generator Medtronic Visia AF MRI VR model [id number] serial number [id number] H Right ventricular lead Medtronic [id number] serial number [id number]  Procedure details: After the risks and benefits of the procedure were discussed the patient provided informed consent and was brought to the cardiac cath lab in the fasting state. The patient was prepped and draped in usual sterile fashion. Local anesthesia with 1% lidocaine was administered to to the left infraclavicular area. A 5-6 cm horizontal incision was made parallel with and 2-3 cm caudal to the left clavicle. Using electrocautery and blunt dissection a prepectoral pocket was created down to the level of the pectoralis major muscle fascia. The pocket was carefully inspected for hemostasis. An antibiotic-soaked sponge was placed in the pocket.  Under fluoroscopic guidance and using the modified Seldinger technique a single venipuncture was performed to access the left subclavian vein. No difficulty was encountered accessing the vein.  A J-tip guidewire was subsequently exchanged for a 9.5 [place] safe sheaths.  Under fluoroscopic guidance the ventricular lead was advanced to level of the mid to apical right ventricular septum and thet active-fixation helix was deployed. Prominent current of injury was seen. Satisfactory pacing and sensing parameters  were recorded. There was no evidence of diaphragmatic stimulation at maximum device output. The safe sheath was peeled away and the lead was secured in place with 2-0 silk.  In similar fashion the right atrial lead was advanced to the level of the atrial appendage. The active-fixation helix was deployed. There was prominent current of injury. Satisfactory  pacing and sensing parameters were recorded. There was no evidence of diaphragmatic stimulation with pacing at maximum device output. The safe sheath was peeled away and the lead was secured in place with 2-0 silk.  The antibiotic-soaked sponge was removed from the pocket. The pocket was flushed with copious amounts of antibiotic solution. Reinspection showed excellent hemostasis.  The ventricular lead was connected to the generator and appropriate ventricular pacing was seen.  Repeat testing of the lead parameters via telemetry showed excellent values.  The entire system was then carefully inserted in the pocket with care been taking that the leads and device assumed a comfortable position without pressure on the incision. Great care was taken that the leads be located deep to the generator. The pocket was then closed in layers using 2 layers of 2-0 Vicryl and cutaneous staples, after which a sterile dressing was applied.  At the end of the procedure the following lead parameters were encountered: Right ventricular lead sensed R waves 13.4 mV, impedance 646 ohms, threshold 0.5 V at 0.4 ms pulse width.  High voltage impedance 88 ohm.  Sanda Klein, MD, [facility] [phone] office [phone] pager

## 2018-10-15 ENCOUNTER — Ambulatory Visit (HOSPITAL_COMMUNITY): Payer: 59

## 2018-10-15 ENCOUNTER — Encounter (HOSPITAL_COMMUNITY): Payer: Self-pay | Admitting: Cardiovascular Disease

## 2018-10-15 DIAGNOSIS — I5022 Chronic systolic (congestive) heart failure: Secondary | ICD-10-CM | POA: Diagnosis not present

## 2018-10-15 DIAGNOSIS — Z006 Encounter for examination for normal comparison and control in clinical research program: Secondary | ICD-10-CM | POA: Diagnosis not present

## 2018-10-15 DIAGNOSIS — I255 Ischemic cardiomyopathy: Secondary | ICD-10-CM | POA: Diagnosis not present

## 2018-10-15 DIAGNOSIS — I110 Hypertensive heart disease with heart failure: Secondary | ICD-10-CM | POA: Diagnosis not present

## 2018-10-15 MED FILL — Midazolam HCl Inj 5 MG/5ML (Base Equivalent): INTRAMUSCULAR | Qty: 5 | Status: AC

## 2018-10-15 MED FILL — Fentanyl Citrate Preservative Free (PF) Inj 100 MCG/2ML: INTRAMUSCULAR | Qty: 2 | Status: AC

## 2018-10-15 NOTE — Progress Notes (Signed)
ICD Criteria  Current LVEF: 30-35%. Within 12 months prior to implant: Yes   Heart failure history: Yes, Class II  Cardiomyopathy history: Yes, Ischemic Cardiomyopathy - Prior MI.  Atrial Fibrillation/Atrial Flutter: No.  Ventricular tachycardia history: No.  Cardiac arrest history: No.  History of syndromes with risk of sudden death: No.  Previous ICD: No.  Current ICD indication: Primary  PPM indication: No.  Class I or II Bradycardia indication present: No  Beta Blocker therapy for 3 or more months: Yes, prescribed.   Ace Inhibitor/ARB therapy for 3 or more months: Yes, prescribed.    I have seen Tyrone Haas is a 62 y.o. malepre-procedural and has been referred by Dr. Adora Fridge for consideration of ICD implant for primary prevention of sudden death.  The patient's chart has been reviewed and they meet criteria for ICD implant.  I have had a thorough discussion with the patient reviewing options.  The patient and their family (if available) have had opportunities to ask questions and have them answered. The patient and I have decided together through the [place] [employer] Tool to implant ICD at this time.  Risks, benefits, alternatives to ICD implantation were discussed in detail with the patient today. The patient  understands that the risks include but are not limited to bleeding, infection, pneumothorax, perforation, tamponade, vascular damage, renal failure, MI, stroke, death, inappropriate shocks, and lead dislodgement and he wishes to proceed.

## 2018-10-15 NOTE — Progress Notes (Signed)
Progress Note  Patient Name: Tyrone Haas Date of Encounter: 10/15/2018  Primary Cardiologist: No primary care provider on file. Berry/Shirla Hodgkiss  Subjective   Minimally sore at site.  Inpatient Medications    Scheduled Meds: . atorvastatin  40 mg Oral q1800  . carvedilol  6.25 mg Oral BID WC  . clopidogrel  75 mg Oral Daily  . niacin  500 mg Oral QHS  . ramipril  2.5 mg Oral Daily  . umeclidinium-vilanterol  1 puff Inhalation Daily   Continuous Infusions: .  ceFAZolin (ANCEF) IV 1 g (10/15/18 0321)   PRN Meds: acetaminophen, ondansetron (ZOFRAN) IV   Vital Signs    Vitals:   10/14/18 1639 10/14/18 2020 10/15/18 0545 10/15/18 0747  BP: (!) 148/93 121/70 (!) 128/94   Pulse: 63 73 62 66  Resp: 16 18 17    Temp: 97.8 F (36.6 C) 98.6 F (37 C) 97.8 F (36.6 C) 98.1 F (36.7 C)  TempSrc: Oral Oral Axillary Oral  SpO2: 95% 94% 96% 96%  Weight:   116.4 kg   Height:        Intake/Output Summary (Last 24 hours) at 10/15/2018 0830 Last data filed at 10/15/2018 0547 Gross per 24 hour  Intake 1280 ml  Output 1300 ml  Net -20 ml   Filed Weights   10/14/18 1312 10/15/18 0545  Weight: 117.9 kg 116.4 kg    Telemetry    NSR - Personally Reviewed  ECG    NSR, old anterolateral and inferior MI, no ST-T changes  - Personally Reviewed  Physical Exam  Obese. Surgical site with a little bit of staining on dressing, no hematoma GEN: No acute distress.   Neck: No JVD Cardiac: RRR, no murmurs, rubs, or gallops.  Respiratory: Clear to auscultation bilaterally. GI: Soft, nontender, non-distended  MS: No edema; No deformity. Neuro:  Nonfocal  Psych: Normal affect   Labs    ChemistryNo results for input(s): NA, K, CL, CO2, GLUCOSE, BUN, CREATININE, CALCIUM, PROT, ALBUMIN, AST, ALT, ALKPHOS, BILITOT, GFRNONAA, GFRAA, ANIONGAP in the last 168 hours.   HematologyNo results for input(s): WBC, RBC, HGB, HCT, MCV, MCH, MCHC, RDW, PLT in the last 168 hours.  Cardiac  EnzymesNo results for input(s): TROPONINI in the last 168 hours. No results for input(s): TROPIPOC in the last 168 hours.   BNPNo results for input(s): BNP, PROBNP in the last 168 hours.   DDimer No results for input(s): DDIMER in the last 168 hours.   Radiology    Dg Chest 2 View  Result Date: 10/15/2018 CLINICAL DATA:  AICD placement. EXAM: CHEST - 2 VIEW COMPARISON:  12/12/2003. FINDINGS: AICD noted with tip over right ventricle. Stable mild cardiomegaly. Improved pulmonary venous congestion. Mild bibasilar atelectasis, improved from prior exam. No pleural effusion or pneumothorax. IMPRESSION: 1. Cardiac pacer with tip over right ventricle. Stable cardiomegaly. Improved pulmonary venous congestion. 2.  Mild bibasilar atelectasis, improved from prior exam Electronically Signed   By: [place]   On: 10/15/2018 06:25    Cardiac Studies   Device check this AM R waves 14.5 mV, impedance 532 ohm, threshold 0.5V@0 .4 ms.  Patient Profile     62 y.o. male with severe ischemic CMP, NYHA class 2 CHF, s/p elective single chamber ICD.  Assessment & Plan    DC home. Wound care, activity restrictions, "shock plan" discussed.  Wound check 10-14 days. F/U w me in 90 days.  For questions or updates, please contact [place] Please consult [email or web address] for contact  info under        Signed, Sanda Klein, MD  10/15/2018, 8:30 AM

## 2018-10-15 NOTE — Progress Notes (Signed)
Discharge Summary    Patient ID: Tyrone Haas MRN: [id number]; DOB: 05-Feb-1956  Admit date: 10/14/2018 Discharge date: 10/15/2018  Primary Care Provider: Raelyn Number, MD  Primary Cardiologist: Dr. Gwenlyn Found, MD     Discharge Diagnoses    Principal Problem:   At risk for sudden cardiac death Active Problems:   Coronary artery disease   Ischemic cardiomyopathy   Chronic systolic heart failure ([place])   ICD (implantable cardioverter-defibrillator) in place  Allergies No Known Allergies  Diagnostic Studies/Procedures    Procedure performed: 1. Implantation of new single chamber cardioverter defibrillator 2. Fluoroscopy 3.  Initial lead and generator testing  Reason for procedure: Primary prevention of sudden cardiac death Ischemic cardiomyopathy, left ventricular ejection fraction less than 30%, Heart failure NYHA class 2, on comprehensive medical therapy for over 90 days (MADIT-II)  At the end of the procedure the following lead parameters were encountered: Right ventricular lead sensed R waves 13.4 mV, impedance 646 ohms, threshold 0.5 V at 0.4 ms pulse width.  High voltage impedance 88 ohm.  History of Present Illness     Tyrone Haas is a 62 y.o. male with a hx of CAD, history of previous extensive anterior myocardial infarction with persistent moderate to severe left ventricular systolic dysfunction with ejection fraction of 30-35% despite medical therapy with maximum tolerated doses of carvedilol and ramipril, referred by Dr. Gwenlyn Found to discuss defibrillator implantation.  Per chart review, he has well compensated congestive heart failure, NYHA functional class II.  He is able to ride horses.  He has problems with shortness of breath bending over but does not have leg edema, orthopnea, PND or angina at rest or with activity.  He denies syncope or palpitations. He does not have known problems with ventricular arrhythmia.  His initial CAD presentation led to  implantation of stents in the LAD and diagonal branch in November 2004 but he stopped his clopidogrel prematurely and presented with acute stent thrombosis and a large anterior wall infarction in February 2005.  He underwent thrombectomy and re-intervention.  Nuclear stress testing has documented a large scar in the anterior wall. EF has been documented as low as 25%.   His most recent echocardiogram from 05/2018 documents an ejection fraction of 30-35%. There was moderate mitral regurgitation and severe left atrial enlargement. Additional medical problems include hypertension, hyperlipidemia and severe obesity.  Plans were made to pursue AICD placement.   Hospital Course   He underwent successful AICD placement on 10/14/18. Post procedure CXR unremarkable. Pt has follow up appointment which has been scheduled. Details and setting are outlined below. Discharge instructions have been reviewed and placed in the patients chart.   Device details: Generator Medtronic Visia AF MRI VR model [id number] serial number [id number] Right ventricular lead Medtronic [id number] serial number [id number]  Settings: At the end of the procedure the following lead parameters were encountered: Right ventricular lead sensed R waves 13.4 mV, impedance 646 ohms, threshold 0.5 V at 0.4 ms pulse width.  High voltage impedance 88 ohm.  The patient has been seen and examined by Dr. Sallyanne Kuster who feels that he is stable and ready for discharge today, 10/15/18.   Consultants: None  Discharge Vitals Blood pressure (!) 128/94, pulse 66, temperature 98.1 F (36.7 C), temperature source Oral, resp. rate 17, height 5\' 11"  (1.803 m), weight 116.4 kg, SpO2 96 %.  Filed Weights   10/14/18 1312 10/15/18 0545  Weight: 117.9 kg 116.4 kg   Labs & Radiologic Studies   _____________  Dg Chest 2 View  Result Date: 10/15/2018 CLINICAL DATA:  AICD placement. EXAM: CHEST - 2 VIEW COMPARISON:  12/12/2003. FINDINGS: AICD noted with tip  over right ventricle. Stable mild cardiomegaly. Improved pulmonary venous congestion. Mild bibasilar atelectasis, improved from prior exam. No pleural effusion or pneumothorax. IMPRESSION: 1. Cardiac pacer with tip over right ventricle. Stable cardiomegaly. Improved pulmonary venous congestion. 2.  Mild bibasilar atelectasis, improved from prior exam Electronically Signed   By: Marcello Moores  Register   On: 10/15/2018 06:25   Disposition   Pt is being discharged home today in good condition.  Follow-up Plans & Appointments    Follow-up Information    Croitoru, Mihai, MD Follow up on 11/08/2018.   Specialty:  Cardiology Why:  Your follow up appointment will be on 11/08/2018 at 1040am. Contact information: [address] [place] [address] [phone]          Discharge Instructions    Call MD for:  difficulty breathing, headache or visual disturbances   Complete by:  As directed    Call MD for:  hives   Complete by:  As directed    Call MD for:  persistant dizziness or light-headedness   Complete by:  As directed    Call MD for:  persistant nausea and vomiting   Complete by:  As directed    Call MD for:  redness, tenderness, or signs of infection (pain, swelling, redness, odor or green/yellow discharge around incision site)   Complete by:  As directed    Call MD for:  severe uncontrolled pain   Complete by:  As directed    Call MD for:  temperature >100.4   Complete by:  As directed    Diet - low sodium heart healthy   Complete by:  As directed    Discharge instructions   Complete by:  As directed    See Dr. Sallyanne Kuster discharge instructions attached  Supplemental Discharge Instructions for  Pacemaker/Defibrillator Patients  Activity Do not raise your left/right arm above shoulder level or extend it backward beyond shoulder level for 2 weeks. Wear the arm sling as a reminder or as needed for comfort for 2 weeks. No heavy lifting or vigorous activity with your  left/right arm for 6-8 weeks.    NO DRIVING is preferable for 2 weeks; If absolutely necessary, drive only short, familiar routes. DO wear your seatbelt, even if it crosses over the pacemaker site.  WOUND CARE Keep the wound area clean and dry.  Remove the dressing the day after you return home (usually 48 hours after the procedure). DO NOT SUBMERGE UNDER WATER UNTIL FULLY HEALED (no tub baths, hot tubs, swimming pools, etc.).  You  may shower or take a sponge bath after the dressing is removed. DO NOT SOAK the area and do not allow the shower to directly spray on the site. If you have staples, these will be removed in the office in 7-14 days. If you have tape/steri-strips on your wound, these will fall off; do not pull them off prematurely.   No bandage is needed on the site.  DO  NOT apply any creams, oils, or ointments to the wound area. If you notice any drainage or discharge from the wound, any swelling, excessive redness or bruising at the site, or if you develop a fever > 101? F after you are discharged home, call the office at once.  Special Instructions You are still able to use cellular telephones.  Avoid carrying your cellular  phone near your device. When traveling through airports, show security personnel your identification card to avoid being screened in the metal detectors.  Avoid arc welding equipment, MRI testing (magnetic resonance imaging), TENS units (transcutaneous nerve stimulators).  Call the office for questions about other devices. Avoid electrical appliances that are in poor condition or are not properly grounded. Microwave ovens are safe to be near or to operate.  Additional information for defibrillator patients should your device go off: If your device goes off ONCE and you feel fine afterward, notify the clinic at [phone]. If your device goes off ONCE and you do not feel well afterward, call 911. If your device goes off TWICE or more in one day, call  911.  DO NOT DRIVE YOURSELF OR A FAMILY MEMBER WITH A DEFIBRILLATOR TO THE HOSPITAL-CALL 911.   Increase activity slowly   Complete by:  As directed      Discharge Medications   Allergies as of 10/15/2018   No Known Allergies     Medication List    TAKE these medications   ANORO ELLIPTA 62.5-25 MCG/INH Aepb Generic drug:  umeclidinium-vilanterol Inhale 1 puff into the lungs daily.   atorvastatin 40 MG tablet Commonly known as:  LIPITOR TAKE 1 TABLET (40 MG TOTAL) BY MOUTH DAILY AT 6 PM.   carvedilol 6.25 MG tablet Commonly known as:  COREG TAKE 1 TABLET BY MOUTH TWICE A DAY WITH MEALS   clopidogrel 75 MG tablet Commonly known as:  PLAVIX TAKE 1 TABLET BY MOUTH EVERY DAY   NIASPAN 500 MG CR tablet Generic drug:  niacin TAKE 1 TABLET (500 MG TOTAL) BY MOUTH AT BEDTIME. What changed:  See the new instructions.   ramipril 2.5 MG capsule Commonly known as:  ALTACE TAKE 1 CAPSULE (2.5 MG TOTAL) BY MOUTH DAILY.        Acute coronary syndrome (MI, NSTEMI, STEMI, etc) this admission?: No.    Outstanding Labs/Studies   None   Duration of Discharge Encounter   Greater than 30 minutes including physician time.  Signed, Kathyrn Drown, NP 10/15/2018, 8:49 AM

## 2018-10-15 NOTE — Discharge Instructions (Signed)

## 2018-10-24 ENCOUNTER — Ambulatory Visit: Payer: 59

## 2018-10-24 ENCOUNTER — Ambulatory Visit (INDEPENDENT_AMBULATORY_CARE_PROVIDER_SITE_OTHER): Payer: 59 | Admitting: Nurse Practitioner

## 2018-10-24 DIAGNOSIS — I5022 Chronic systolic (congestive) heart failure: Secondary | ICD-10-CM

## 2018-10-24 LAB — CUP PACEART INCLINIC DEVICE CHECK
Date Time Interrogation Session: 20200109094324
Implantable Lead Implant Date: 20191230
Implantable Lead Location: 753860
Implantable Pulse Generator Implant Date: 20191230

## 2018-10-24 NOTE — Progress Notes (Signed)
Wound check appointment. Staples removed. Wound without redness or edema. Incision edges approximated, wound well healed. Normal device function. Thresholds, sensing, and impedances consistent with implant measurements. Device programmed at 3.5V for extra safety margin until 3 month visit. Histogram distribution appropriate for patient and level of activity. No mode switches or ventricular arrhythmias noted. Patient educated about wound care, arm mobility, lifting restrictions, shock plan. ROV in 3 months with implanting physician.

## 2018-11-08 ENCOUNTER — Ambulatory Visit (INDEPENDENT_AMBULATORY_CARE_PROVIDER_SITE_OTHER): Payer: 59 | Admitting: Cardiovascular Disease

## 2018-11-08 ENCOUNTER — Encounter: Payer: Self-pay | Admitting: Cardiovascular Disease

## 2018-11-08 VITALS — BP 126/74 | HR 68 | Ht 71.0 in | Wt 272.2 lb

## 2018-11-08 DIAGNOSIS — Z95810 Presence of automatic (implantable) cardiac defibrillator: Secondary | ICD-10-CM | POA: Diagnosis not present

## 2018-11-08 NOTE — Progress Notes (Signed)
Cardiology Office Note:    Date:  11/08/2018   ID:  Tyrone Haas, DOB July 09, 1956, MRN [id number]  PCP:  Raelyn Number, MD  Cardiologist:  No primary care provider on file.  Electrophysiologist:  None   Referring MD: Raelyn Number, MD   No chief complaint on file.   History of Present Illness:    Tyrone Haas is a 63 y.o. male with a hx of CAD, history of previous extensive anterior myocardial infarction with persistent moderate to severe left ventricular systolic dysfunction with ejection fraction of 30-35% despite medical therapy with maximum tolerated doses of carvedilol and ramipril, referred by Dr. Gwenlyn Found to discuss defibrillator implantation.  He has well compensated congestive heart failure, NYHA functional class II.  He is able to ride horses.  He has problems with shortness of breath bending over but does not have leg edema, orthopnea, PND or angina at rest or with activity.  He denies syncope or palpitations.  He does not have known problems with ventricular arrhythmia.  His initial presentation CAD led to implantation of stents in the LAD and diagonal branch in November 2004 but he stopped his clopidogrel prematurely and presented with acute stent thrombosis and a large anterior wall infarction in February 2005.  He underwent thrombectomy and re-intervention.  Nuclear stress testing has documented a large scar in the anterior wall.  EF has been documented as low as 25%.   His most recent echocardiogram from August 2019 documents an ejection fraction of 30-35%.  There was moderate mitral regurgitation and severe left atrial enlargement.  Diastolic parameters suggested that he had volume overload.  PA pressure was reportedly normal at 22.  Additional medical problems include hypertension, hyperlipidemia and severe obesity.  He underwent elective implantation of a single-chamber defibrillator about a month ago and had a wound check with Chanetta Marshall on January 9.  Appointment  scheduled for today is premature and will see him back in 2 months.  The wound has healed very nicely.  He has not had any defibrillator discharges.  We did review his shock plan today.  Past Medical History:  Diagnosis Date  . AICD (automatic cardioverter/defibrillator) present 10/14/2018  . Anterior wall myocardial infarction [facility]) 2005    large anterior wall myocardial infarction related to subacute stent thrombosis/notes 07/23/2013  . CAD (coronary artery disease)   . COPD (chronic obstructive pulmonary disease) ([place])   . History of kidney stones   . History of tobacco abuse    Heavy tobacco use  . Hyperlipidemia   . Hypertension   . Left ventricular dysfunction   . Mild renal insufficiency   . Noncompliance     Past Surgical History:  Procedure Laterality Date  . CORONARY ANGIOPLASTY WITH STENT PLACEMENT  08/2003; 2005  . ICD IMPLANT N/A 10/14/2018   Procedure: ICD IMPLANT;  Surgeon: Sanda Klein, MD;  Location: [facility];  Service: Cardiovascular;  Laterality: N/A;  . INSERTION OF ICD  10/14/2018  . TONSILLECTOMY AND ADENOIDECTOMY    . TYMPANOPLASTY Left 1989; 1999   ""tried to rebuild eardrum, patched hole; reversed 1st procedure and patched again"    Current Medications: Current Meds  Medication Sig  . atorvastatin (LIPITOR) 40 MG tablet TAKE 1 TABLET (40 MG TOTAL) BY MOUTH DAILY AT 6 PM.  . carvedilol (COREG) 6.25 MG tablet TAKE 1 TABLET BY MOUTH TWICE A DAY WITH MEALS  . clopidogrel (PLAVIX) 75 MG tablet TAKE 1 TABLET BY MOUTH EVERY DAY  .  NIASPAN 500 MG CR tablet TAKE 1 TABLET (500 MG TOTAL) BY MOUTH AT BEDTIME. (Patient taking differently: Take 500 mg by mouth at bedtime. )  . ramipril (ALTACE) 2.5 MG capsule TAKE 1 CAPSULE (2.5 MG TOTAL) BY MOUTH DAILY.  Marland [place] umeclidinium-vilanterol (ANORO ELLIPTA) 62.5-25 MCG/INH AEPB Inhale 1 puff into the lungs daily.     Allergies:   Patient has no known allergies.   Social History   Socioeconomic History  .  Marital status: Divorced    Spouse name: Not on file  . Number of children: Not on file  . Years of education: Not on file  . Highest education level: Not on file  Occupational History  . Not on file  Social Needs  . Financial resource strain: Not on file  . Food insecurity:    Worry: Not on file    Inability: Not on file  . Transportation needs:    Medical: Not on file    Non-medical: Not on file  Tobacco Use  . Smoking status: Former Smoker    Packs/day: 1.50    Years: 35.00    Pack years: 52.50    Types: Cigarettes    Last attempt to quit: 07/23/2009    Years since quitting: 9.3  . Smokeless tobacco: Never Used  Substance and Sexual Activity  . Alcohol use: Yes    Alcohol/week: 4.0 standard drinks    Types: 4 Standard drinks or equivalent per week  . Drug use: Never  . Sexual activity: Yes  Lifestyle  . Physical activity:    Days per week: Not on file    Minutes per session: Not on file  . Stress: Not on file  Relationships  . Social connections:    Talks on phone: Not on file    Gets together: Not on file    Attends religious service: Not on file    Active member of club or organization: Not on file    Attends meetings of clubs or organizations: Not on file    Relationship status: Not on file  Other Topics Concern  . Not on file  Social History Narrative  . Not on file     Family History: The patient's family history includes Cancer in his mother; Coronary artery disease in his father; Diabetes in his father; Hypertension in his mother; Stroke in his father.  ROS:   Please see the history of present illness.     All other systems reviewed and are negative.  EKGs/Labs/Other Studies Reviewed:    The following studies were reviewed today: Echo from August 2019, notes from Dr. Gwenlyn Found  EKG:  EKG is ordered today.  It is unchanged from his preprocedural ECG and shows sinus rhythm with sequelae of an extensive old anterior infarct  Recent Labs: 10/02/2018:  BUN 19; Creatinine, Ser 0.90; Hemoglobin 16.3; Platelets 211; Potassium 4.5; Sodium 138  Recent Lipid Panel No results found for: CHOL, TRIG, HDL, CHOLHDL, VLDL, LDLCALC, LDLDIRECT  Physical Exam:    VS:  BP 126/74   Pulse 68   Ht 5\' 11"  (1.803 m)   Wt 272 lb 3.2 oz (123.5 kg)   BMI 37.96 kg/m     Wt Readings from Last 3 Encounters:  11/08/18 272 lb 3.2 oz (123.5 kg)  10/15/18 256 lb 9.9 oz (116.4 kg)  09/19/18 267 lb 9.6 oz (121.4 kg)    Healed defibrillator site GEN: Severely obese, well nourished, well developed in no acute distress HEENT: Normal NECK: No JVD; No carotid  bruits LYMPHATICS: No lymphadenopathy CARDIAC: RRR, no murmurs, rubs, gallops RESPIRATORY:  Clear to auscultation without rales, wheezing or rhonchi  ABDOMEN: Soft, non-tender, non-distended MUSCULOSKELETAL:  No edema; No deformity  SKIN: Warm and dry NEUROLOGIC:  Alert and oriented x 3 PSYCHIATRIC:  Normal affect   ASSESSMENT:    No diagnosis found. PLAN:    In order of problems listed above:  1. ICD: Surgical site is well-healed.  Discussed shock plan.  No further restrictions with activity.  2. CHF: Appears euvolemic, NYHA functional class II. 3. HTN: excellent control. 4. HLP: On statin and Niaspan, no recent lipid profile available for review. 5. CAD: Status post extensive anterior wall myocardial infarction, on chronic clopidogrel therapy, asymptomatic.   Medication Adjustments/Labs and Tests Ordered: Current medicines are reviewed at length with the patient today.  Concerns regarding medicines are outlined above.  No orders of the defined types were placed in this encounter.  No orders of the defined types were placed in this encounter.   There are no Patient Instructions on file for this visit.   Signed, Sanda Klein, MD  11/08/2018 10:53 AM    [facility]

## 2018-11-13 NOTE — Addendum Note (Signed)
Addended by: Zebedee Iba on: 11/13/2018 09:27 AM   Modules accepted: Orders

## 2019-01-14 ENCOUNTER — Ambulatory Visit (INDEPENDENT_AMBULATORY_CARE_PROVIDER_SITE_OTHER): Payer: 59 | Admitting: *Deleted

## 2019-01-14 ENCOUNTER — Other Ambulatory Visit: Payer: Self-pay

## 2019-01-14 DIAGNOSIS — I255 Ischemic cardiomyopathy: Secondary | ICD-10-CM | POA: Diagnosis not present

## 2019-01-14 LAB — CUP PACEART REMOTE DEVICE CHECK
Battery Remaining Longevity: 136 mo
Battery Voltage: 3.14 V
Brady Statistic RV Percent Paced: 0.01 %
Date Time Interrogation Session: 20200331020602
HighPow Impedance: 73 Ohm
Implantable Lead Implant Date: 20191230
Implantable Lead Location: 753860
Implantable Pulse Generator Implant Date: 20191230
Lead Channel Impedance Value: 399 Ohm
Lead Channel Impedance Value: 475 Ohm
Lead Channel Pacing Threshold Amplitude: 0.625 V
Lead Channel Pacing Threshold Pulse Width: 0.4 ms
Lead Channel Sensing Intrinsic Amplitude: 14.875 mV
Lead Channel Sensing Intrinsic Amplitude: 14.875 mV
Lead Channel Setting Pacing Amplitude: 2.5 V
Lead Channel Setting Pacing Pulse Width: 0.4 ms
Lead Channel Setting Sensing Sensitivity: 0.3 mV

## 2019-01-22 ENCOUNTER — Encounter: Payer: Self-pay | Admitting: Cardiology

## 2019-01-22 NOTE — Progress Notes (Signed)
Remote ICD transmission.   

## 2019-01-24 ENCOUNTER — Other Ambulatory Visit: Payer: Self-pay | Admitting: Cardiovascular Disease

## 2019-02-12 ENCOUNTER — Encounter: Payer: 59 | Admitting: Cardiovascular Disease

## 2019-04-15 ENCOUNTER — Ambulatory Visit (INDEPENDENT_AMBULATORY_CARE_PROVIDER_SITE_OTHER): Payer: 59 | Admitting: *Deleted

## 2019-04-15 DIAGNOSIS — I255 Ischemic cardiomyopathy: Secondary | ICD-10-CM | POA: Diagnosis not present

## 2019-04-16 LAB — CUP PACEART REMOTE DEVICE CHECK
Battery Remaining Longevity: 135 mo
Battery Voltage: 3.1 V
Brady Statistic RV Percent Paced: 0.01 %
Date Time Interrogation Session: 20200701043525
HighPow Impedance: 82 Ohm
Implantable Lead Implant Date: 20191230
Implantable Lead Location: 753860
Implantable Pulse Generator Implant Date: 20191230
Lead Channel Impedance Value: 399 Ohm
Lead Channel Impedance Value: 475 Ohm
Lead Channel Pacing Threshold Amplitude: 0.625 V
Lead Channel Pacing Threshold Pulse Width: 0.4 ms
Lead Channel Sensing Intrinsic Amplitude: 14 mV
Lead Channel Sensing Intrinsic Amplitude: 14 mV
Lead Channel Setting Pacing Amplitude: 2.5 V
Lead Channel Setting Pacing Pulse Width: 0.4 ms
Lead Channel Setting Sensing Sensitivity: 0.3 mV

## 2019-04-27 ENCOUNTER — Encounter: Payer: Self-pay | Admitting: Cardiology

## 2019-04-27 NOTE — Progress Notes (Signed)
Remote ICD transmission.   

## 2019-05-12 NOTE — Progress Notes (Signed)
Cardiology Office Note:    Date:  05/19/2019   ID:  Tyrone Haas, DOB 29-Nov-1955, MRN [id number]  PCP:  Irven Shelling, MD  Cardiologist:  Daiva Nakayama (ICD) Electrophysiologist:  None   Referring MD: Irven Shelling, MD   Chief Complaint  Patient presents with  . Congestive Heart Failure  . Coronary Artery Disease  ICD follow up  History of Present Illness:    Tyrone Haas is a 63 y.o. male with a hx of CAD, history of previous extensive anterior myocardial infarction with persistent moderate to severe left ventricular systolic dysfunction with ejection fraction of 30-35% despite medical therapy with maximum tolerated doses of carvedilol and ramipril, now roughly 1 year s/p primary prevention defibrillator implantation.   The patient specifically denies any chest pain at rest exertion, dyspnea at rest or with exertion, orthopnea, paroxysmal nocturnal dyspnea, syncope, palpitations, focal neurological deficits, intermittent claudication, lower extremity edema, unexplained weight gain, cough, hemoptysis or wheezing.  His ICD detected 2 episodes of nonsustained ventricular tachycardia in March, both of them brief (1 second and 3 seconds respectively, approximately 200 bpm) not meeting to therapy.  Device check shows otherwise normal findings with estimated longevity of 11 years, no need for ventricular pacing, no treated episodes of VT/VF, excellent lead parameters, good activity level (roughly 7 hours/day), good separation between daytime and nighttime heart rates and stable thoracic impedance/OptiVol level.  He has an upcoming appointment with Dr. Gwenlyn Found.  His initial presentation with CAD led to implantation of stents in the LAD and diagonal branch in November 2004 but he stopped his clopidogrel prematurely and presented with acute stent thrombosis and a large anterior wall infarction in February 2005.  He underwent thrombectomy and re-intervention.  Nuclear stress testing has  documented a large scar in the anterior wall.  EF has been documented as low as 25%.   His most recent echocardiogram from August 2019 documents an ejection fraction of 30-35%.  There was moderate mitral regurgitation and severe left atrial enlargement.  Diastolic parameters suggested that he had volume overload.  PA pressure was reportedly normal at 22.  Additional medical problems include hypertension, hyperlipidemia and severe obesity.   Past Medical History:  Diagnosis Date  . AICD (automatic cardioverter/defibrillator) present 10/14/2018  . Anterior wall myocardial infarction [facility]) 2005    large anterior wall myocardial infarction related to subacute stent thrombosis/notes 07/23/2013  . CAD (coronary artery disease)   . COPD (chronic obstructive pulmonary disease) ([place])   . History of kidney stones   . History of tobacco abuse    Heavy tobacco use  . Hyperlipidemia   . Hypertension   . Left ventricular dysfunction   . Mild renal insufficiency   . Noncompliance     Past Surgical History:  Procedure Laterality Date  . CORONARY ANGIOPLASTY WITH STENT PLACEMENT  08/2003; 2005  . ICD IMPLANT N/A 10/14/2018   Procedure: ICD IMPLANT;  Surgeon: Sanda Klein, MD;  Location: [facility];  Service: Cardiovascular;  Laterality: N/A;  . INSERTION OF ICD  10/14/2018  . TONSILLECTOMY AND ADENOIDECTOMY    . TYMPANOPLASTY Left 1989; 1999   ""tried to rebuild eardrum, patched hole; reversed 1st procedure and patched again"    Current Medications: Current Meds  Medication Sig  . atorvastatin (LIPITOR) 40 MG tablet TAKE 1 TABLET (40 MG TOTAL) BY MOUTH DAILY AT 6 PM.  . carvedilol (COREG) 6.25 MG tablet TAKE 1 TABLET BY MOUTH TWICE A DAY WITH MEALS  . clopidogrel (PLAVIX) 75  MG tablet TAKE 1 TABLET BY MOUTH EVERY DAY  . NIASPAN 500 MG CR tablet TAKE 1 TABLET (500 MG TOTAL) BY MOUTH AT BEDTIME. (Patient taking differently: Take 500 mg by mouth at bedtime. )  . ramipril (ALTACE) 2.5  MG capsule TAKE 1 CAPSULE (2.5 MG TOTAL) BY MOUTH DAILY.  Marland Kitchen umeclidinium-vilanterol (ANORO ELLIPTA) 62.5-25 MCG/INH AEPB Inhale 1 puff into the lungs daily.     Allergies:   Patient has no known allergies.   Social History   Socioeconomic History  . Marital status: Divorced    Spouse name: Not on file  . Number of children: Not on file  . Years of education: Not on file  . Highest education level: Not on file  Occupational History  . Not on file  Social Needs  . Financial resource strain: Not on file  . Food insecurity    Worry: Not on file    Inability: Not on file  . Transportation needs    Medical: Not on file    Non-medical: Not on file  Tobacco Use  . Smoking status: Former Smoker    Packs/day: 1.50    Years: 35.00    Pack years: 52.50    Types: Cigarettes    Quit date: 07/23/2009    Years since quitting: 9.8  . Smokeless tobacco: Never Used  Substance and Sexual Activity  . Alcohol use: Yes    Alcohol/week: 4.0 standard drinks    Types: 4 Standard drinks or equivalent per week  . Drug use: Never  . Sexual activity: Yes  Lifestyle  . Physical activity    Days per week: Not on file    Minutes per session: Not on file  . Stress: Not on file  Relationships  . Social Herbalist on phone: Not on file    Gets together: Not on file    Attends religious service: Not on file    Active member of club or organization: Not on file    Attends meetings of clubs or organizations: Not on file    Relationship status: Not on file  Other Topics Concern  . Not on file  Social History Narrative  . Not on file     Family History: The patient's family history includes Cancer in his mother; Coronary artery disease in his father; Diabetes in his father; Hypertension in his mother; Stroke in his father.  ROS:   Please see the history of present illness.    All other systems are reviewed and are negative.  EKGs/Labs/Other Studies Reviewed:    The following  studies were reviewed today:   EKG:  EKG is ordered today.  It shows:  Recent Labs: 10/02/2018: BUN 19; Creatinine, Ser 0.90; Hemoglobin 16.3; Platelets 211; Potassium 4.5; Sodium 138  Recent Lipid Panel No results found for: CHOL, TRIG, HDL, CHOLHDL, VLDL, LDLCALC, LDLDIRECT  Physical Exam:    VS:  BP (!) 147/93   Pulse 69   Temp (!) 96.9 F (36.1 C)   Ht 5\' 11"  (1.803 m)   Wt 260 lb (117.9 kg)   SpO2 95%   BMI 36.26 kg/m     Wt Readings from Last 3 Encounters:  05/19/19 260 lb (117.9 kg)  11/08/18 272 lb 3.2 oz (123.5 kg)  10/15/18 256 lb 9.9 oz (116.4 kg)     General: Alert, oriented x3, no distress, obese.  Well-healed left subclavian defibrillator site obese. Well healed left subclavian ICD. Head: no evidence of trauma, PERRL, EOMI,  no exophtalmos or lid lag, no myxedema, no xanthelasma; normal ears, nose and oropharynx Neck: normal jugular venous pulsations and no hepatojugular reflux; brisk carotid pulses without delay and no carotid bruits Chest: clear to auscultation, no signs of consolidation by percussion or palpation, normal fremitus, symmetrical and full respiratory excursions Cardiovascular: normal position and quality of the apical impulse, regular rhythm, normal first and second heart sounds, no murmurs, rubs or gallops Abdomen: no tenderness or distention, no masses by palpation, no abnormal pulsatility or arterial bruits, normal bowel sounds, no hepatosplenomegaly Extremities: no clubbing, cyanosis or edema; 2+ radial, ulnar and brachial pulses bilaterally; 2+ right femoral, posterior tibial and dorsalis pedis pulses; 2+ left femoral, posterior tibial and dorsalis pedis pulses; no subclavian or femoral bruits Neurological: grossly nonfocal Psych: Normal mood and affect   ASSESSMENT:    1. Chronic systolic heart failure ([place])   2. Coronary artery disease involving native coronary artery of native heart without angina pectoris   3. ICD (implantable  cardioverter-defibrillator) in place   4. Hypercholesterolemia    PLAN:    In order of problems listed above:  1. CHF: Excellent functional status and euvolemic despite not using loop diuretics.  On beta-blocker and R AAS inhibitors.   2. CAD: Asymptomatic/angina free despite being very physically active.. Status post extensive anterior wall myocardial infarction, on chronic clopidogrel therapy. 3. ICD: Normal device function. Continue remote downloads every 3 months.  Shot plan reviewed. 4. HTN: Blood pressure is elevated today but this is reportedly atypical.  No changes made to his medications.  Consider increasing heart ACE inhibitor or switching to Entresto. 5. HLP: On statin and Niaspan. Needs a repeat lipid profile, but he already ate breakfast today.  Recheck when he comes in to see Dr. Gwenlyn Found.    Medication Adjustments/Labs and Tests Ordered: Current medicines are reviewed at length with the patient today.  Concerns regarding medicines are outlined above.  Orders Placed This Encounter  Procedures  . EKG 12-Lead   No orders of the defined types were placed in this encounter.   Patient Instructions  Medication Instructions:  Your physician recommends that you continue on your current medications as directed. Please refer to the Current Medication list given to you today.  If you need a refill on your cardiac medications before your next appointment, please call your pharmacy.   Lab work: None ordered If you have labs (blood work) drawn today and your tests are completely normal, you will receive your results only by: [place] (if you have MyChart) OR A paper copy in the mail If you have any lab test that is abnormal or we need to change your treatment, we will call you to review the results.  Testing/Procedures: None ordered  Follow-Up: At [facility], you and your health needs are our priority.  As part of our continuing mission to provide you with  exceptional heart care, we have created designated Provider Care Teams.  These Care Teams include your primary Cardiologist (physician) and Advanced Practice Providers (APPs -  Physician Assistants and Nurse Practitioners) who all work together to provide you with the care you need, when you need it. You will need a follow up appointment in 12 months.  Please call our office 2 months in advance to schedule this appointment.  You may see Sanda Klein, MD or one of the following Advanced Practice Providers on your designated Care Team: Almyra Deforest, PA-C Fabian Sharp, PA-C         Signed,  Sanda Klein, MD  05/19/2019 8:25 AM    [facility]

## 2019-05-15 ENCOUNTER — Telehealth: Payer: Self-pay | Admitting: *Deleted

## 2019-05-15 NOTE — Telephone Encounter (Signed)
Left a message to call back to confirm the appointment for Monday and for prescreening.

## 2019-05-16 NOTE — Telephone Encounter (Signed)
New message   Patient's girlfriend is returning your call. Please call.

## 2019-05-16 NOTE — Telephone Encounter (Signed)
The patient confirmed his appointment for Monday with Dr. Sallyanne Kuster. He has been advised to wear a mask.      COVID-19 Pre-Screening Questions:  . In the past 7 to 10 days have you had a cough,  shortness of breath, headache, congestion, fever (100 or greater) body aches, chills, sore throat, or sudden loss of taste or sense of smell? No . Have you been around anyone with known Covid 19. No . Have you been around anyone who is awaiting Covid 19 test results in the past 7 to 10 days? No . Have you been around anyone who has been exposed to Covid 19, or has mentioned symptoms of Covid 19 within the past 7 to 10 days? No  If you have any concerns/questions about symptoms patients report during screening (either on the phone or at [facility]). Contact the provider seeing the patient or DOD for further guidance.  If neither are available contact a member of the leadership team.

## 2019-05-19 ENCOUNTER — Other Ambulatory Visit: Payer: Self-pay

## 2019-05-19 ENCOUNTER — Ambulatory Visit (INDEPENDENT_AMBULATORY_CARE_PROVIDER_SITE_OTHER): Payer: 59 | Admitting: Cardiovascular Disease

## 2019-05-19 ENCOUNTER — Encounter: Payer: Self-pay | Admitting: Cardiovascular Disease

## 2019-05-19 VITALS — BP 147/93 | HR 69 | Temp 96.9°F | Ht 71.0 in | Wt 260.0 lb

## 2019-05-19 DIAGNOSIS — I2510 Atherosclerotic heart disease of native coronary artery without angina pectoris: Secondary | ICD-10-CM | POA: Diagnosis not present

## 2019-05-19 DIAGNOSIS — Z95810 Presence of automatic (implantable) cardiac defibrillator: Secondary | ICD-10-CM | POA: Diagnosis not present

## 2019-05-19 DIAGNOSIS — E7800 Pure hypercholesterolemia, unspecified: Secondary | ICD-10-CM | POA: Diagnosis not present

## 2019-05-19 DIAGNOSIS — I10 Essential (primary) hypertension: Secondary | ICD-10-CM

## 2019-05-19 DIAGNOSIS — I5022 Chronic systolic (congestive) heart failure: Secondary | ICD-10-CM | POA: Diagnosis not present

## 2019-05-19 NOTE — Patient Instructions (Signed)

## 2019-05-20 ENCOUNTER — Other Ambulatory Visit: Payer: Self-pay

## 2019-05-20 DIAGNOSIS — I2510 Atherosclerotic heart disease of native coronary artery without angina pectoris: Secondary | ICD-10-CM

## 2019-05-20 DIAGNOSIS — I5022 Chronic systolic (congestive) heart failure: Secondary | ICD-10-CM

## 2019-05-20 DIAGNOSIS — Z95810 Presence of automatic (implantable) cardiac defibrillator: Secondary | ICD-10-CM

## 2019-05-20 DIAGNOSIS — I10 Essential (primary) hypertension: Secondary | ICD-10-CM

## 2019-07-14 ENCOUNTER — Other Ambulatory Visit: Payer: Self-pay | Admitting: Cardiovascular Disease

## 2019-07-15 ENCOUNTER — Ambulatory Visit (INDEPENDENT_AMBULATORY_CARE_PROVIDER_SITE_OTHER): Payer: 59 | Admitting: *Deleted

## 2019-07-15 DIAGNOSIS — I255 Ischemic cardiomyopathy: Secondary | ICD-10-CM | POA: Diagnosis not present

## 2019-07-16 LAB — CUP PACEART REMOTE DEVICE CHECK
Battery Remaining Longevity: 134 mo
Battery Voltage: 3.07 V
Brady Statistic RV Percent Paced: 0.03 %
Date Time Interrogation Session: 20200929062823
HighPow Impedance: 76 Ohm
Implantable Lead Implant Date: 20191230
Implantable Lead Location: 753860
Implantable Pulse Generator Implant Date: 20191230
Lead Channel Impedance Value: 304 Ohm
Lead Channel Impedance Value: 361 Ohm
Lead Channel Pacing Threshold Amplitude: 0.75 V
Lead Channel Pacing Threshold Pulse Width: 0.4 ms
Lead Channel Sensing Intrinsic Amplitude: 11 mV
Lead Channel Sensing Intrinsic Amplitude: 11 mV
Lead Channel Setting Pacing Amplitude: 2.5 V
Lead Channel Setting Pacing Pulse Width: 0.4 ms
Lead Channel Setting Sensing Sensitivity: 0.3 mV

## 2019-07-25 ENCOUNTER — Encounter: Payer: Self-pay | Admitting: Cardiology

## 2019-07-25 NOTE — Progress Notes (Signed)
Remote ICD transmission.   

## 2019-07-29 ENCOUNTER — Encounter: Payer: Self-pay | Admitting: Cardiovascular Disease

## 2019-07-29 ENCOUNTER — Ambulatory Visit: Payer: 59 | Admitting: Cardiovascular Disease

## 2019-07-29 ENCOUNTER — Other Ambulatory Visit: Payer: Self-pay

## 2019-07-29 DIAGNOSIS — I10 Essential (primary) hypertension: Secondary | ICD-10-CM | POA: Diagnosis not present

## 2019-07-29 DIAGNOSIS — I2510 Atherosclerotic heart disease of native coronary artery without angina pectoris: Secondary | ICD-10-CM | POA: Diagnosis not present

## 2019-07-29 DIAGNOSIS — E7800 Pure hypercholesterolemia, unspecified: Secondary | ICD-10-CM

## 2019-07-29 DIAGNOSIS — I255 Ischemic cardiomyopathy: Secondary | ICD-10-CM

## 2019-07-29 DIAGNOSIS — Z95810 Presence of automatic (implantable) cardiac defibrillator: Secondary | ICD-10-CM

## 2019-07-29 NOTE — Assessment & Plan Note (Signed)
History of essential hypertension with blood pressure measured today at 128/73.  He is on carvedilol and ramipril.

## 2019-07-29 NOTE — Progress Notes (Signed)
07/29/2019 Tyrone Haas   [birth year]  [id number]  Primary Physician Bonnita Nasuti, MD Primary Cardiologist: Lorretta Harp MD FACP, [place], [place], [place]  HPI:  Tyrone Haas is a 63 y.o.  mildly overweight separated Caucasian male, father of 4 children, who I last saw  06/26/2018.Marland Kitchen He has a history of CAD status post LAD and diagonal branch bifurcation stenting, November of 2004, by myself. 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 He was studied urgently and underwent thrombectomy and re-intervention. At his initial catheterization, EF was 50%, but subsequent to that, at the time of his second catheterization, his EF was 25%, and by echocardiogram 3 years ago EF had come up to 35% to 40%. Myoview showed a large scar in the LAD territory. He stopped smoking at that time. His other problems include hypertension and hyperlipidemia.   At my request he saw Dr. Sallyanne Kuster for consideration of ICD implantation for primary prevention which was subsequently done 10/14/2018.  He said no discharges.  Since I saw him a year ago he is remained stable denying chest pain or shortness of breath.   Current Meds  Medication Sig  . atorvastatin (LIPITOR) 40 MG tablet TAKE 1 TABLET (40 MG TOTAL) BY MOUTH DAILY AT 6 PM.  . carvedilol (COREG) 6.25 MG tablet TAKE 1 TABLET BY MOUTH TWICE A DAY WITH MEALS  . clopidogrel (PLAVIX) 75 MG tablet TAKE 1 TABLET BY MOUTH EVERY DAY  . NIASPAN 500 MG CR tablet TAKE 1 TABLET (500 MG TOTAL) BY MOUTH AT BEDTIME.  . ramipril (ALTACE) 2.5 MG capsule TAKE 1 CAPSULE BY MOUTH EVERY DAY  . umeclidinium-vilanterol (ANORO ELLIPTA) 62.5-25 MCG/INH AEPB Inhale 1 puff into the lungs daily.     No Known Allergies  Social History   Socioeconomic History  . Marital status: Divorced    Spouse name: Not on file  . Number of children: Not on file  . Years of  education: Not on file  . Highest education level: Not on file  Occupational History  . Not on file  Social Needs  . Financial resource strain: Not on file  . Food insecurity    Worry: Not on file    Inability: Not on file  . Transportation needs    Medical: Not on file    Non-medical: Not on file  Tobacco Use  . Smoking status: Former Smoker    Packs/day: 1.50    Years: 35.00    Pack years: 52.50    Types: Cigarettes    Quit date: 07/23/2009    Years since quitting: 10.0  . Smokeless tobacco: Never Used  Substance and Sexual Activity  . Alcohol use: Yes    Alcohol/week: 4.0 standard drinks    Types: 4 Standard drinks or equivalent per week  . Drug use: Never  . Sexual activity: Yes  Lifestyle  . Physical activity    Days per week: Not on file    Minutes per session: Not on file  . Stress: Not on file  Relationships  . Social Herbalist on phone: Not on file    Gets together: Not on file    Attends religious service: Not on file    Active member of club or organization: Not on file    Attends meetings of clubs or organizations: Not on file    Relationship status: Not on  file  . Intimate partner violence    Fear of current or ex partner: Not on file    Emotionally abused: Not on file    Physically abused: Not on file    Forced sexual activity: Not on file  Other Topics Concern  . Not on file  Social History Narrative  . Not on file     Review of Systems: General: negative for chills, fever, night sweats or weight changes.  Cardiovascular: negative for chest pain, dyspnea on exertion, edema, orthopnea, palpitations, paroxysmal nocturnal dyspnea or shortness of breath Dermatological: negative for rash Respiratory: negative for cough or wheezing Urologic: negative for hematuria Abdominal: negative for nausea, vomiting, diarrhea, bright red blood per rectum, melena, or hematemesis Neurologic: negative for visual changes, syncope, or dizziness All other  systems reviewed and are otherwise negative except as noted above.    Blood pressure 128/73, pulse 72, height 5\' 11"  (1.803 m), weight 263 lb (119.3 kg), SpO2 93 %.  General appearance: alert and no distress Neck: no adenopathy, no carotid bruit, no JVD, supple, symmetrical, trachea midline and thyroid not enlarged, symmetric, no tenderness/mass/nodules Lungs: clear to auscultation bilaterally Heart: regular rate and rhythm, S1, S2 normal, no murmur, click, rub or gallop Extremities: extremities normal, atraumatic, no cyanosis or edema Pulses: 2+ and symmetric Skin: Skin color, texture, turgor normal. No rashes or lesions Neurologic: Alert and oriented X 3, normal strength and tone. Normal symmetric reflexes. Normal coordination and gait  EKG not performed today  ASSESSMENT AND PLAN:   Coronary artery disease History of CAD status post LAD diagonal branch bifurcation stenting by myself November 2004.  Unfortunately he stopped his Plavix and presented December 10, 2003 with a large antral myocardial infarction related to acute stent thrombosis.  He was studied urgently and underwent thrombectomy and reintervention.  His EF has been in the 30 to 35% since that time on dual antiplatelet therapy.  He denies chest pain or shortness of breath.  Essential hypertension History of essential hypertension with blood pressure measured today at 128/73.  He is on carvedilol and ramipril.  Hypercholesterolemia History of hyperlipidemia on statin therapy.  We will recheck a lipid liver profile.  Ischemic cardiomyopathy History of ischemic heart myopathy with an EF in the 30 to 35% range by 2D echocardiogram performed 05/16/2018.  He is on appropriate medical therapy and is asymptomatic.  ICD (implantable cardioverter-defibrillator) in place History of ICD implantation by Dr. Sallyanne Kuster 10/14/2018 for primary prevention.  He just saw Dr. Sallyanne Kuster back in the office 05/19/2019.  He said no device discharges  or therapy with 11 years left on his battery      Lorretta Harp MD [facility], [facility] 07/29/2019 8:12 AM

## 2019-07-29 NOTE — Assessment & Plan Note (Signed)
History of ischemic heart myopathy with an EF in the 30 to 35% range by 2D echocardiogram performed 05/16/2018.  He is on appropriate medical therapy and is asymptomatic.

## 2019-07-29 NOTE — Patient Instructions (Signed)
Medication Instructions:  Your physician recommends that you continue on your current medications as directed. Please refer to the Current Medication list given to you today.  If you need a refill on your cardiac medications before your next appointment, please call your pharmacy.   Lab work: Your physician recommends that you return for lab work within 1 week: fasting lipid and liver panels  If you have labs (blood work) drawn today and your tests are completely normal, you will receive your results only by: Marland Kitchen MyChart Message (if you have MyChart) OR . A paper copy in the mail If you have any lab test that is abnormal or we need to change your treatment, we will call you to review the results.  Testing/Procedures: none  Follow-Up: At [facility], you and your health needs are our priority.  As part of our continuing mission to provide you with exceptional heart care, we have created designated Provider Care Teams.  These Care Teams include your primary Cardiologist (physician) and Advanced Practice Providers (APPs -  Physician Assistants and Nurse Practitioners) who all work together to provide you with the care you need, when you need it. You will need a follow up appointment in 12 months with Dr. Quay Burow.  Please call our office 2 months in advance to schedule this/each appointment.

## 2019-07-29 NOTE — Assessment & Plan Note (Signed)
History of hyperlipidemia on statin therapy.  We will recheck a lipid liver profile 

## 2019-07-29 NOTE — Assessment & Plan Note (Signed)
History of ICD implantation by Dr. Sallyanne Kuster 10/14/2018 for primary prevention.  He just saw Dr. Sallyanne Kuster back in the office 05/19/2019.  He said no device discharges or therapy with 11 years left on his battery

## 2019-07-29 NOTE — Assessment & Plan Note (Signed)
History of CAD status post LAD diagonal branch bifurcation stenting by myself November 2004.  Unfortunately he stopped his Plavix and presented December 10, 2003 with a large antral myocardial infarction related to acute stent thrombosis.  He was studied urgently and underwent thrombectomy and reintervention.  His EF has been in the 30 to 35% since that time on dual antiplatelet therapy.  He denies chest pain or shortness of breath.

## 2019-07-29 NOTE — Addendum Note (Signed)
Addended by: Annita Brod on: 07/29/2019 08:16 AM   Modules accepted: Orders

## 2019-08-08 ENCOUNTER — Other Ambulatory Visit: Payer: Self-pay | Admitting: Cardiovascular Disease

## 2019-08-11 ENCOUNTER — Other Ambulatory Visit: Payer: Self-pay | Admitting: Cardiovascular Disease

## 2019-09-29 ENCOUNTER — Telehealth: Payer: Self-pay | Admitting: Cardiovascular Disease

## 2019-09-29 NOTE — Telephone Encounter (Signed)
Tyrone Haas, called stating that patient's PCP is wanting to put him on [employer]. He wants to know what Dr. Gwenlyn Found thinks about it.

## 2019-09-29 NOTE — Telephone Encounter (Signed)
I don't know what that is actually

## 2019-10-01 NOTE — Telephone Encounter (Signed)
Pt advised and will talk further with the PCP.

## 2019-10-04 ENCOUNTER — Other Ambulatory Visit: Payer: Self-pay | Admitting: Cardiovascular Disease

## 2019-10-14 ENCOUNTER — Ambulatory Visit (INDEPENDENT_AMBULATORY_CARE_PROVIDER_SITE_OTHER): Payer: 59 | Admitting: *Deleted

## 2019-10-14 DIAGNOSIS — I5022 Chronic systolic (congestive) heart failure: Secondary | ICD-10-CM

## 2019-10-14 LAB — CUP PACEART REMOTE DEVICE CHECK
Battery Remaining Longevity: 133 mo
Battery Voltage: 3.05 V
Brady Statistic RV Percent Paced: 0.01 %
Date Time Interrogation Session: 20201229001603
HighPow Impedance: 76 Ohm
Implantable Lead Implant Date: 20191230
Implantable Lead Location: 753860
Implantable Pulse Generator Implant Date: 20191230
Lead Channel Impedance Value: 304 Ohm
Lead Channel Impedance Value: 361 Ohm
Lead Channel Pacing Threshold Amplitude: 0.75 V
Lead Channel Pacing Threshold Pulse Width: 0.4 ms
Lead Channel Sensing Intrinsic Amplitude: 12.25 mV
Lead Channel Sensing Intrinsic Amplitude: 12.25 mV
Lead Channel Setting Pacing Amplitude: 2.5 V
Lead Channel Setting Pacing Pulse Width: 0.4 ms
Lead Channel Setting Sensing Sensitivity: 0.3 mV

## 2019-10-15 NOTE — Progress Notes (Signed)
ICD remote 

## 2020-01-13 ENCOUNTER — Ambulatory Visit (INDEPENDENT_AMBULATORY_CARE_PROVIDER_SITE_OTHER): Payer: 59 | Admitting: *Deleted

## 2020-01-13 DIAGNOSIS — I5022 Chronic systolic (congestive) heart failure: Secondary | ICD-10-CM | POA: Diagnosis not present

## 2020-01-13 LAB — CUP PACEART REMOTE DEVICE CHECK
Battery Remaining Longevity: 131 mo
Battery Voltage: 3.05 V
Brady Statistic RV Percent Paced: 0.01 %
Date Time Interrogation Session: 20210330001806
HighPow Impedance: 77 Ohm
Implantable Lead Implant Date: 20191230
Implantable Lead Location: 753860
Implantable Pulse Generator Implant Date: 20191230
Lead Channel Impedance Value: 304 Ohm
Lead Channel Impedance Value: 361 Ohm
Lead Channel Pacing Threshold Amplitude: 0.75 V
Lead Channel Pacing Threshold Pulse Width: 0.4 ms
Lead Channel Sensing Intrinsic Amplitude: 12.625 mV
Lead Channel Sensing Intrinsic Amplitude: 12.625 mV
Lead Channel Setting Pacing Amplitude: 2.5 V
Lead Channel Setting Pacing Pulse Width: 0.4 ms
Lead Channel Setting Sensing Sensitivity: 0.3 mV

## 2020-01-13 NOTE — Progress Notes (Signed)
ICD remote 

## 2020-03-17 ENCOUNTER — Other Ambulatory Visit: Payer: Self-pay | Admitting: Cardiovascular Disease

## 2020-04-13 ENCOUNTER — Ambulatory Visit (INDEPENDENT_AMBULATORY_CARE_PROVIDER_SITE_OTHER): Payer: 59 | Admitting: *Deleted

## 2020-04-13 DIAGNOSIS — I5022 Chronic systolic (congestive) heart failure: Secondary | ICD-10-CM

## 2020-04-13 DIAGNOSIS — I255 Ischemic cardiomyopathy: Secondary | ICD-10-CM

## 2020-04-13 LAB — CUP PACEART REMOTE DEVICE CHECK
Battery Remaining Longevity: 129 mo
Battery Voltage: 3.04 V
Brady Statistic RV Percent Paced: 0.01 %
Date Time Interrogation Session: 20210629001603
HighPow Impedance: 75 Ohm
Implantable Lead Implant Date: 20191230
Implantable Lead Location: 753860
Implantable Pulse Generator Implant Date: 20191230
Lead Channel Impedance Value: 285 Ohm
Lead Channel Impedance Value: 361 Ohm
Lead Channel Pacing Threshold Amplitude: 0.875 V
Lead Channel Pacing Threshold Pulse Width: 0.4 ms
Lead Channel Sensing Intrinsic Amplitude: 14.5 mV
Lead Channel Sensing Intrinsic Amplitude: 14.5 mV
Lead Channel Setting Pacing Amplitude: 2.5 V
Lead Channel Setting Pacing Pulse Width: 0.4 ms
Lead Channel Setting Sensing Sensitivity: 0.3 mV

## 2020-04-15 NOTE — Progress Notes (Signed)
Remote ICD transmission.   

## 2020-06-14 ENCOUNTER — Ambulatory Visit (INDEPENDENT_AMBULATORY_CARE_PROVIDER_SITE_OTHER): Payer: 59 | Admitting: Cardiovascular Disease

## 2020-06-14 ENCOUNTER — Other Ambulatory Visit: Payer: Self-pay

## 2020-06-14 ENCOUNTER — Encounter: Payer: Self-pay | Admitting: Cardiovascular Disease

## 2020-06-14 VITALS — BP 110/67 | HR 72 | Ht 71.0 in | Wt 218.8 lb

## 2020-06-14 DIAGNOSIS — Z95810 Presence of automatic (implantable) cardiac defibrillator: Secondary | ICD-10-CM | POA: Diagnosis not present

## 2020-06-14 DIAGNOSIS — I5022 Chronic systolic (congestive) heart failure: Secondary | ICD-10-CM | POA: Diagnosis not present

## 2020-06-14 DIAGNOSIS — I2510 Atherosclerotic heart disease of native coronary artery without angina pectoris: Secondary | ICD-10-CM

## 2020-06-14 DIAGNOSIS — I10 Essential (primary) hypertension: Secondary | ICD-10-CM

## 2020-06-14 DIAGNOSIS — E7800 Pure hypercholesterolemia, unspecified: Secondary | ICD-10-CM

## 2020-06-14 NOTE — Patient Instructions (Signed)

## 2020-06-14 NOTE — Progress Notes (Signed)
Cardiology Office Note:    Date:  06/14/2020   ID:  Tyrone Haas, DOB 11-30-1955, MRN [id number]  PCP:  Tyrone Nasuti, MD  Cardiologist:  Daiva Nakayama (ICD) Electrophysiologist:  None   Referring MD: Tyrone Nasuti, MD   Chief Complaint  Patient presents with  . Congestive Heart Failure  ICD follow up  History of Present Illness:    Tyrone Haas is a 64 y.o. male with a hx of CAD, history of previous extensive anterior myocardial infarction with persistent moderate to severe left ventricular systolic dysfunction with ejection fraction of 30-35% despite medical therapy with maximum tolerated doses of carvedilol and ramipril, now roughly 1.5 years s/p primary prevention defibrillator implantation.   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  His defibrillator documents roughly 7 hours a day of movement. The patient specifically denies any chest pain at rest exertion, dyspnea at rest or with exertion, orthopnea, paroxysmal nocturnal dyspnea, syncope, palpitations, focal neurological deficits, intermittent claudication, lower extremity edema, unexplained weight gain, cough, hemoptysis or wheezing.  In the last year his device has detected only 1 episode of nonsustained ventricular tachycardia.  This was very fast in the VF zone at 236 bpm and appears to be monomorphic.  It lasted for about 3 seconds (17 beats) and occurred at midnight in October 2020, he believes he would have been asleep at the time).  He does not require ventricular pacing.  All lead parameters are excellent.  Estimated generator longevity is over 10 years.  His OptiVol has stayed within normal range.  He has an excellent separation between daytime and nighttime average heart rates consistent with his heart failure compensation.  He reports having labs checked by his primary care provider about a month ago with these are not currently available for  review.  His initial presentation with CAD led to implantation of stents in the LAD and diagonal branch in November 2004 but he stopped his clopidogrel prematurely and presented with acute stent thrombosis and a large anterior wall infarction in February 2005.  He underwent thrombectomy and re-intervention.  Nuclear stress testing has documented a large scar in the anterior wall.  EF has been documented as low as 25%.   His most recent echocardiogram from August 2019 documents an ejection fraction of 30-35%.  There was moderate mitral regurgitation and severe left atrial enlargement.  Diastolic parameters suggested that he had volume overload.  PA pressure was reportedly normal at 22.  Additional medical problems include hypertension, hyperlipidemia and severe obesity.   Past Medical History:  Diagnosis Date  . AICD (automatic cardioverter/defibrillator) present 10/14/2018  . Anterior wall myocardial infarction [facility]) 2005    large anterior wall myocardial infarction related to subacute stent thrombosis/notes 07/23/2013  . CAD (coronary artery disease)   . COPD (chronic obstructive pulmonary disease) ([place])   . History of kidney stones   . History of tobacco abuse    Heavy tobacco use  . Hyperlipidemia   . Hypertension   . Left ventricular dysfunction   . Mild renal insufficiency   . Noncompliance     Past Surgical History:  Procedure Laterality Date  . CORONARY ANGIOPLASTY WITH STENT PLACEMENT  08/2003; 2005  . ICD IMPLANT N/A 10/14/2018   Procedure: ICD IMPLANT;  Surgeon: Sanda Klein, MD;  Location: [facility];  Service: Cardiovascular;  Laterality: N/A;  . INSERTION OF ICD  10/14/2018  . TONSILLECTOMY AND ADENOIDECTOMY    .  TYMPANOPLASTY Left 1989; 1999   ""tried to rebuild eardrum, patched hole; reversed 1st procedure and patched again"    Current Medications: Current Meds  Medication Sig  . atorvastatin (LIPITOR) 40 MG tablet TAKE 1 TABLET (40 MG TOTAL) BY MOUTH  DAILY AT 6 PM.  . carvedilol (COREG) 6.25 MG tablet Take 1 tablet (6.25 mg total) by mouth 2 (two) times daily with a meal.  . clopidogrel (PLAVIX) 75 MG tablet TAKE 1 TABLET BY MOUTH EVERY DAY  . NIASPAN 500 MG CR tablet TAKE 1 TABLET BY MOUTH AT BEDTIME  . ramipril (ALTACE) 2.5 MG capsule TAKE 1 CAPSULE BY MOUTH EVERY DAY  . umeclidinium-vilanterol (ANORO ELLIPTA) 62.5-25 MCG/INH AEPB Inhale 1 puff into the lungs daily.     Allergies:   Patient has no known allergies.   Social History   Socioeconomic History  . Marital status: Divorced    Spouse name: Not on file  . Number of children: Not on file  . Years of education: Not on file  . Highest education level: Not on file  Occupational History  . Not on file  Tobacco Use  . Smoking status: Former Smoker    Packs/day: 1.50    Years: 35.00    Pack years: 52.50    Types: Cigarettes    Quit date: 07/23/2009    Years since quitting: 10.9  . Smokeless tobacco: Never Used  Vaping Use  . Vaping Use: Never used  Substance and Sexual Activity  . Alcohol use: Yes    Alcohol/week: 4.0 standard drinks    Types: 4 Standard drinks or equivalent per week  . Drug use: Never  . Sexual activity: Yes  Other Topics Concern  . Not on file  Social History Narrative  . Not on file   Social Determinants of Health   Financial Resource Strain:   . Difficulty of Paying Living Expenses: Not on file  Food Insecurity:   . Worried About Charity fundraiser in the Last Year: Not on file  . Ran Out of Food in the Last Year: Not on file  Transportation Needs:   . Lack of Transportation (Medical): Not on file  . Lack of Transportation (Non-Medical): Not on file  Physical Activity:   . Days of Exercise per Week: Not on file  . Minutes of Exercise per Session: Not on file  Stress:   . Feeling of Stress : Not on file  Social Connections:   . Frequency of Communication with Friends and Family: Not on file  . Frequency of Social Gatherings with  Friends and Family: Not on file  . Attends Religious Services: Not on file  . Active Member of Clubs or Organizations: Not on file  . Attends Archivist Meetings: Not on file  . Marital Status: Not on file     Family History: The patient's family history includes Cancer in his mother; Coronary artery disease in his father; Diabetes in his father; Hypertension in his mother; Stroke in his father.  ROS:   Please see the history of present illness.    All other systems are reviewed and are negative.   EKGs/Labs/Other Studies Reviewed:    The following studies were reviewed today: Comprehensive ICD interrogation the office today.  EKG:  EKG is ordered today.  It shows: Supraventricular rhythm, partly sinus tachycardia.  The P wave morphology changes throughout the tracing suggesting a period of ectopic atrial rhythm, followed by sinus rhythm.  As before he has Q  waves across the anterior precordium all the way to V5 consistent with his extensive old anterolateral infarction.  There is subtle residual ST segment elevation in V3-V5.  QTc is normal at 427 ms.  Recent Labs: No results found for requested labs within last 8760 hours.  Recent Lipid Panel No results found for: CHOL, TRIG, HDL, CHOLHDL, VLDL, LDLCALC, LDLDIRECT  Physical Exam:    VS:  BP 110/67   Pulse 72   Ht 5\' 11"  (1.803 m)   Wt 218 lb 12.8 oz (99.2 kg)   SpO2 98%   BMI 30.52 kg/m     Wt Readings from Last 3 Encounters:  06/14/20 218 lb 12.8 oz (99.2 kg)  07/29/19 263 lb (119.3 kg)  05/19/19 260 lb (117.9 kg)      General: Alert, oriented x3, no distress, he is only borderline obese and has lost substantial weight.  The defibrillator site is well-healed. Head: no evidence of trauma, PERRL, EOMI, no exophtalmos or lid lag, no myxedema, no xanthelasma; normal ears, nose and oropharynx Neck: normal jugular venous pulsations and no hepatojugular reflux; brisk carotid pulses without delay and no carotid  bruits Chest: clear to auscultation, no signs of consolidation by percussion or palpation, normal fremitus, symmetrical and full respiratory excursions Cardiovascular: normal position and quality of the apical impulse, regular rhythm, normal first and second heart sounds, no murmurs, rubs or gallops Abdomen: no tenderness or distention, no masses by palpation, no abnormal pulsatility or arterial bruits, normal bowel sounds, no hepatosplenomegaly Extremities: no clubbing, cyanosis or edema; 2+ radial, ulnar and brachial pulses bilaterally; 2+ right femoral, posterior tibial and dorsalis pedis pulses; 2+ left femoral, posterior tibial and dorsalis pedis pulses; no subclavian or femoral bruits Neurological: grossly nonfocal Psych: Normal mood and affect    ASSESSMENT:    1. Chronic systolic heart failure ([place])   2. Coronary artery disease involving native coronary artery of native heart without angina pectoris   3. ICD (implantable cardioverter-defibrillator) in place   4. Essential hypertension   5. Hypercholesterolemia    PLAN:    In order of problems listed above:  1. CHF: Excellent functional status.  On beta-blockers and ACE inhibitor.  Maintaining euvolemic status without the use of diuretics. 2. CAD: extensive old anterior infarction, but currently asymptomatic despite intense physical activity.  On long-term clopidogrel. 3. ICD: Normal device function.  He's only had about 3 episodes of nonsustained VT in the 20 months since device implantation, none in the last 10 months.  Continue remote downloads every 3 months and yearly office visits. 4. HTN: Blood pressure in optimal range. 5. HLP: Reports having labs checked about a month ago with PCP.  He thinks this included a lipid profile.  We will request a copy.    Medication Adjustments/Labs and Tests Ordered: Current medicines are reviewed at length with the patient today.  Concerns regarding medicines are outlined above.  Orders  Placed This Encounter  Procedures  . EKG 12-Lead   No orders of the defined types were placed in this encounter.   Patient Instructions  Medication Instructions:  No changes *If you need a refill on your cardiac medications before your next appointment, please call your pharmacy*   Lab Work: None ordered If you have labs (blood work) drawn today and your tests are completely normal, you will receive your results only by: Marland Kitchen MyChart Message (if you have MyChart) OR . A paper copy in the mail If you have any lab test that is abnormal  or we need to change your treatment, we will call you to review the results.   Testing/Procedures: None ordered   Follow-Up: At [facility], you and your health needs are our priority.  As part of our continuing mission to provide you with exceptional heart care, we have created designated Provider Care Teams.  These Care Teams include your primary Cardiologist (physician) and Advanced Practice Providers (APPs -  Physician Assistants and Nurse Practitioners) who all work together to provide you with the care you need, when you need it.  We recommend signing up for the patient portal called "MyChart".  Sign up information is provided on this After Visit Summary.  MyChart is used to connect with patients for Virtual Visits (Telemedicine).  Patients are able to view lab/test results, encounter notes, upcoming appointments, etc.  Non-urgent messages can be sent to your provider as well.   To learn more about what you can do with MyChart, go to [email or web address].    Your next appointment:   12 month(s)  The format for your next appointment:   In Person  Provider:   Sanda Klein, MD      Signed, Sanda Klein, MD  06/14/2020 8:32 AM    [place]

## 2020-07-10 ENCOUNTER — Other Ambulatory Visit: Payer: Self-pay | Admitting: Cardiovascular Disease

## 2020-08-19 ENCOUNTER — Other Ambulatory Visit: Payer: Self-pay | Admitting: Cardiovascular Disease

## 2020-08-20 ENCOUNTER — Other Ambulatory Visit: Payer: Self-pay | Admitting: Cardiovascular Disease

## 2020-09-07 ENCOUNTER — Encounter: Payer: Self-pay | Admitting: Cardiovascular Disease

## 2020-09-07 ENCOUNTER — Other Ambulatory Visit: Payer: Self-pay

## 2020-09-07 ENCOUNTER — Ambulatory Visit: Payer: 59 | Admitting: Cardiovascular Disease

## 2020-09-07 DIAGNOSIS — I255 Ischemic cardiomyopathy: Secondary | ICD-10-CM

## 2020-09-07 DIAGNOSIS — I10 Essential (primary) hypertension: Secondary | ICD-10-CM

## 2020-09-07 DIAGNOSIS — E7800 Pure hypercholesterolemia, unspecified: Secondary | ICD-10-CM | POA: Diagnosis not present

## 2020-09-07 DIAGNOSIS — I2510 Atherosclerotic heart disease of native coronary artery without angina pectoris: Secondary | ICD-10-CM

## 2020-09-07 DIAGNOSIS — Z95810 Presence of automatic (implantable) cardiac defibrillator: Secondary | ICD-10-CM

## 2020-09-07 NOTE — Assessment & Plan Note (Signed)
History of ischemic cardiomyopathy with an EF of 35% by recent 2D echo performed 05/16/2018.  He is on carvedilol and ramipril.

## 2020-09-07 NOTE — Assessment & Plan Note (Signed)
History of hyperlipidemia on high-dose atorvastatin followed by his PCP.

## 2020-09-07 NOTE — Assessment & Plan Note (Signed)
History of CAD status post LAD/diagonal branch bifurcation PCI and drug-eluting stenting.  Unfortunately, he discontinued Plavix prematurely and had acute stent thrombosis presenting 12/10/2003 with a large anterior wall myocardial infarction.  He underwent thrombectomy and reintervention.  His EF has been in the 30 to 35% range since that time on optimal medical therapy.  He denies chest pain or shortness of breath.

## 2020-09-07 NOTE — Assessment & Plan Note (Signed)
History of ICD implantation by Dr. Sallyanne Kuster for primary prevention 10/14/2018.  He saw Dr. Sallyanne Kuster 06/14/2020.  He had one episode of monomorphic VT lasting 3 seconds while he was sleeping but otherwise he has not had any discharge and his OptiVol measurements have been within the normal range.

## 2020-09-07 NOTE — Assessment & Plan Note (Signed)
History of essential hypertension a blood pressure measured today 102/60.  He is on carvedilol and ramipril.

## 2020-09-07 NOTE — Patient Instructions (Signed)

## 2020-09-07 NOTE — Progress Notes (Signed)
09/07/2020 Tyrone Haas   07-09-1956  [id number]  Primary Physician Bonnita Nasuti, MD Primary Cardiologist: Lorretta Harp MD FACP, [place], [place], [place]  HPI:  Tyrone Haas is a 64 y.o.  mildly overweight separated Caucasian male, father of 4 children, who I last saw  07/29/2019.Marland Kitchen He is accompanied by his girlfriend Tyrone Haas.  He has a history of CAD status post LAD and diagonal branch bifurcation stenting, November of 2004, by myself. 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 He was studied urgently and underwent thrombectomy and re-intervention. At his initial catheterization, EF was 50%, but subsequent to that, at the time of his second catheterization, his EF was 25%, and by echocardiogram 3 years ago EF had come up to 35% to 40%. Myoview showed a large scar in the LAD territory. He stopped smoking at that time. His other problems include hypertension and hyperlipidemia.   At my request he saw Dr. Sallyanne Kuster for consideration of ICD implantation for primary prevention which was subsequently done 10/14/2018.  He said no discharges.    Since I saw him a year ago he continues to do well.  He did get pneumonia recently and was hospitalized.  He also had ureteral stents placed and has nephrolithiasis.  He scheduled to have his stones removed sometime in the next couple weeks.  His pneumonia has improved.  He otherwise is asymptomatic.    Current Meds  Medication Sig  . aspirin 81 MG EC tablet Take 81 mg by mouth daily. Swallow whole.  Marland Kitchen atorvastatin (LIPITOR) 40 MG tablet TAKE 1 TABLET (40 MG TOTAL) BY MOUTH DAILY AT 6 PM.  . carvedilol (COREG) 6.25 MG tablet TAKE 1 TABLET BY MOUTH TWICE A DAY WITH MEALS  . cefdinir (OMNICEF) 300 MG capsule Take 300 mg by mouth 2 (two) times daily.  . clopidogrel (PLAVIX) 75 MG tablet TAKE 1 TABLET BY MOUTH EVERY DAY  . furosemide  (LASIX) 40 MG tablet Take 40 mg by mouth.  Marland Kitchen NIASPAN 500 MG CR tablet TAKE 1 TABLET BY MOUTH EVERYDAY AT BEDTIME  . ramipril (ALTACE) 2.5 MG capsule TAKE 1 CAPSULE BY MOUTH EVERY DAY  . umeclidinium-vilanterol (ANORO ELLIPTA) 62.5-25 MCG/INH AEPB Inhale 1 puff into the lungs daily.     No Known Allergies  Social History   Socioeconomic History  . Marital status: Divorced    Spouse name: Not on file  . Number of children: Not on file  . Years of education: Not on file  . Highest education level: Not on file  Occupational History  . Not on file  Tobacco Use  . Smoking status: Former Smoker    Packs/day: 1.50    Years: 35.00    Pack years: 52.50    Types: Cigarettes    Quit date: 07/23/2009    Years since quitting: 11.1  . Smokeless tobacco: Never Used  Vaping Use  . Vaping Use: Never used  Substance and Sexual Activity  . Alcohol use: Yes    Alcohol/week: 4.0 standard drinks    Types: 4 Standard drinks or equivalent per week  . Drug use: Never  . Sexual activity: Yes  Other Topics Concern  . Not on file  Social History Narrative  . Not on file   Social Determinants of Health   Financial Resource Strain:   . Difficulty of Paying Living Expenses: Not on file  Food  Insecurity:   . Worried About Charity fundraiser in the Last Year: Not on file  . Ran Out of Food in the Last Year: Not on file  Transportation Needs:   . Lack of Transportation (Medical): Not on file  . Lack of Transportation (Non-Medical): Not on file  Physical Activity:   . Days of Exercise per Week: Not on file  . Minutes of Exercise per Session: Not on file  Stress:   . Feeling of Stress : Not on file  Social Connections:   . Frequency of Communication with Friends and Family: Not on file  . Frequency of Social Gatherings with Friends and Family: Not on file  . Attends Religious Services: Not on file  . Active Member of Clubs or Organizations: Not on file  . Attends Archivist Meetings:  Not on file  . Marital Status: Not on file  Intimate Partner Violence:   . Fear of Current or Ex-Partner: Not on file  . Emotionally Abused: Not on file  . Physically Abused: Not on file  . Sexually Abused: Not on file     Review of Systems: General: negative for chills, fever, night sweats or weight changes.  Cardiovascular: negative for chest pain, dyspnea on exertion, edema, orthopnea, palpitations, paroxysmal nocturnal dyspnea or shortness of breath Dermatological: negative for rash Respiratory: negative for cough or wheezing Urologic: negative for hematuria Abdominal: negative for nausea, vomiting, diarrhea, bright red blood per rectum, melena, or hematemesis Neurologic: negative for visual changes, syncope, or dizziness All other systems reviewed and are otherwise negative except as noted above.    Blood pressure 102/60, pulse 77, height 5\' 11"  (1.803 m), weight 197 lb (89.4 kg).  General appearance: alert and no distress Neck: no adenopathy, no carotid bruit, no JVD, supple, symmetrical, trachea midline and thyroid not enlarged, symmetric, no tenderness/mass/nodules Lungs: clear to auscultation bilaterally Heart: regular rate and rhythm, S1, S2 normal, no murmur, click, rub or gallop Extremities: extremities normal, atraumatic, no cyanosis or edema Pulses: 2+ and symmetric Skin: Skin color, texture, turgor normal. No rashes or lesions Neurologic: Alert and oriented X 3, normal strength and tone. Normal symmetric reflexes. Normal coordination and gait  EKG not performed today  ASSESSMENT AND PLAN:   Coronary artery disease History of CAD status post LAD/diagonal branch bifurcation PCI and drug-eluting stenting.  Unfortunately, he discontinued Plavix prematurely and had acute stent thrombosis presenting 12/10/2003 with a large anterior wall myocardial infarction.  He underwent thrombectomy and reintervention.  His EF has been in the 30 to 35% range since that time on optimal  medical therapy.  He denies chest pain or shortness of breath.  Essential hypertension History of essential hypertension a blood pressure measured today 102/60.  He is on carvedilol and ramipril.  Hypercholesterolemia History of hyperlipidemia on high-dose atorvastatin followed by his PCP.  Ischemic cardiomyopathy History of ischemic cardiomyopathy with an EF of 35% by recent 2D echo performed 05/16/2018.  He is on carvedilol and ramipril.  ICD (implantable cardioverter-defibrillator) in place History of ICD implantation by Dr. Sallyanne Kuster for primary prevention 10/14/2018.  He saw Dr. Sallyanne Kuster 06/14/2020.  He had one episode of monomorphic VT lasting 3 seconds while he was sleeping but otherwise he has not had any discharge and his OptiVol measurements have been within the normal range.      Lorretta Harp MD FACP,FACC,FAHA, [facility] 09/07/2020 11:10 AM

## 2020-10-04 ENCOUNTER — Telehealth: Payer: Self-pay | Admitting: Cardiovascular Disease

## 2020-10-04 NOTE — Telephone Encounter (Signed)
Called  ,left message on voicemail  To call back to give the below information :   per recent office visit with Dr Gwenlyn Found or Dr Sallyanne Kuster, there was no change in patient  Recent medication list .   Patient should be taking Plavix 75 mg one tablet daily , Ramipril 2.5 mg one tablet daily

## 2020-10-04 NOTE — Telephone Encounter (Signed)
Pt c/o medication issue:  1. Name of Medication:  clopidogrel (PLAVIX) 75 MG tablet  ramipril (ALTACE) 2.5 MG capsule  2. How are you currently taking this medication (dosage and times per day)? As prescribed.  3. Are you having a reaction (difficulty breathing--STAT)? No.  4. What is your medication issue? Patients significant other Murlean Iba is calling with questions about his medication and how the patient should be taking them. Patient states that he was verbally told to change his dosage. Please advise.

## 2020-10-05 NOTE — Telephone Encounter (Signed)
Mardene Celeste returning call.

## 2020-10-05 NOTE — Telephone Encounter (Signed)
Left message for pt to call, aware there are no medication changes.

## 2020-10-05 NOTE — Telephone Encounter (Signed)
Spoke with the patient's girlfriend. She has been advised about the defibrillator and will call back after the patient has had a few more appointments. She did say that the patient has declined to do treatment and has approximately 3-6 months according to the physician. She will call back with an update next week.

## 2020-10-05 NOTE — Telephone Encounter (Signed)
I am very sorry to hear the bad news.  I would recommend turning off defibrillator tachycardia therapies.  We can continue using the device to monitor for rhythm abnormalities, but it will not shock him if he develops a terminal rhythm. This can be done electronically, without surgery, but needs to be done in person in the office.  We can do it at the [place] office or in the device clinic at [facility], it is not urgent.

## 2020-10-05 NOTE — Telephone Encounter (Signed)
Spoke with Tyrone Haas, the patient was recently in [facility] and he was given the diagnosis of terminal pancreatic cancer. The medications were stopped during that hospital stay and they want to make sure okay with dr berry. Also they are asking about what to expect from the ICD when he dies. Aware will forward to dr berry and dr croitoru to review.

## 2020-10-05 NOTE — Telephone Encounter (Signed)
I'm sorry to hear that as well.  I agree with Dr. Sallyanne Kuster about turning off delivery/treatment features of the ICD.  As far as his medications are concerned I think it is dependent upon how limited his demise is expected to be.  If he is going to live a significant length of time keeping his heart failure meds on board might be worthwhile.  Otherwise, I have no problem stopping his medications and just treating him with comfort measures.Tyrone Haas

## 2020-10-11 ENCOUNTER — Telehealth: Payer: Self-pay | Admitting: Cardiovascular Disease

## 2020-10-11 ENCOUNTER — Ambulatory Visit: Payer: 59 | Admitting: Cardiovascular Disease

## 2020-10-11 NOTE — Telephone Encounter (Signed)
Patients significant other is calling to get an update from Dr. Royann Shivers or his nurse on when patient should come in to have his DEFIB turned off. Please advise.

## 2020-10-11 NOTE — Telephone Encounter (Signed)
Spoke with Murlean Iba (ok per DPR). Significant other is concerned about defibrillator and possibility of it shocking her husband. Pt able to make appointment today with Dr. Loletha Grayer at 11:40am. Truitt Leep understanding and will be here at appointment time.

## 2020-10-11 NOTE — Telephone Encounter (Signed)
Patients significant other is calling to see when the patient can turn off his Defib

## 2020-10-11 NOTE — Telephone Encounter (Signed)
Spoke with pt's significant other (DPR). Switch pt's appointment from today to 12/30 at 10am. Pt's SO states that the pt is not feeling good today and having a hard time getting out of bed. Mardene Celeste verbalizes understand on new appointment time.

## 2020-10-11 NOTE — Telephone Encounter (Signed)
Patient's SO calling back. She states the patient is not feeling well enough to come in today. She states she was offered 10/14/2021 as well.

## 2020-10-12 ENCOUNTER — Telehealth: Payer: Self-pay | Admitting: Cardiovascular Disease

## 2020-10-12 ENCOUNTER — Ambulatory Visit (INDEPENDENT_AMBULATORY_CARE_PROVIDER_SITE_OTHER): Payer: 59

## 2020-10-12 DIAGNOSIS — I5022 Chronic systolic (congestive) heart failure: Secondary | ICD-10-CM | POA: Diagnosis not present

## 2020-10-12 LAB — CUP PACEART REMOTE DEVICE CHECK
Battery Remaining Longevity: 124 mo
Battery Voltage: 3.03 V
Brady Statistic RV Percent Paced: 0.01 %
Date Time Interrogation Session: 20211228022603
HighPow Impedance: 66 Ohm
Implantable Lead Implant Date: 20191230
Implantable Lead Location: 753860
Implantable Pulse Generator Implant Date: 20191230
Lead Channel Impedance Value: 304 Ohm
Lead Channel Impedance Value: 342 Ohm
Lead Channel Pacing Threshold Amplitude: 0.875 V
Lead Channel Pacing Threshold Pulse Width: 0.4 ms
Lead Channel Sensing Intrinsic Amplitude: 11 mV
Lead Channel Sensing Intrinsic Amplitude: 11 mV
Lead Channel Setting Pacing Amplitude: 2.5 V
Lead Channel Setting Pacing Pulse Width: 0.4 ms
Lead Channel Setting Sensing Sensitivity: 0.3 mV

## 2020-10-12 IMAGING — DX DG CHEST 2V
2 series · 2 of 2 positions shown · non-contrast
Comparison: 12/12/2003.

CLINICAL DATA: AICD placement.

EXAM:
CHEST - 2 VIEW

[chest pa]
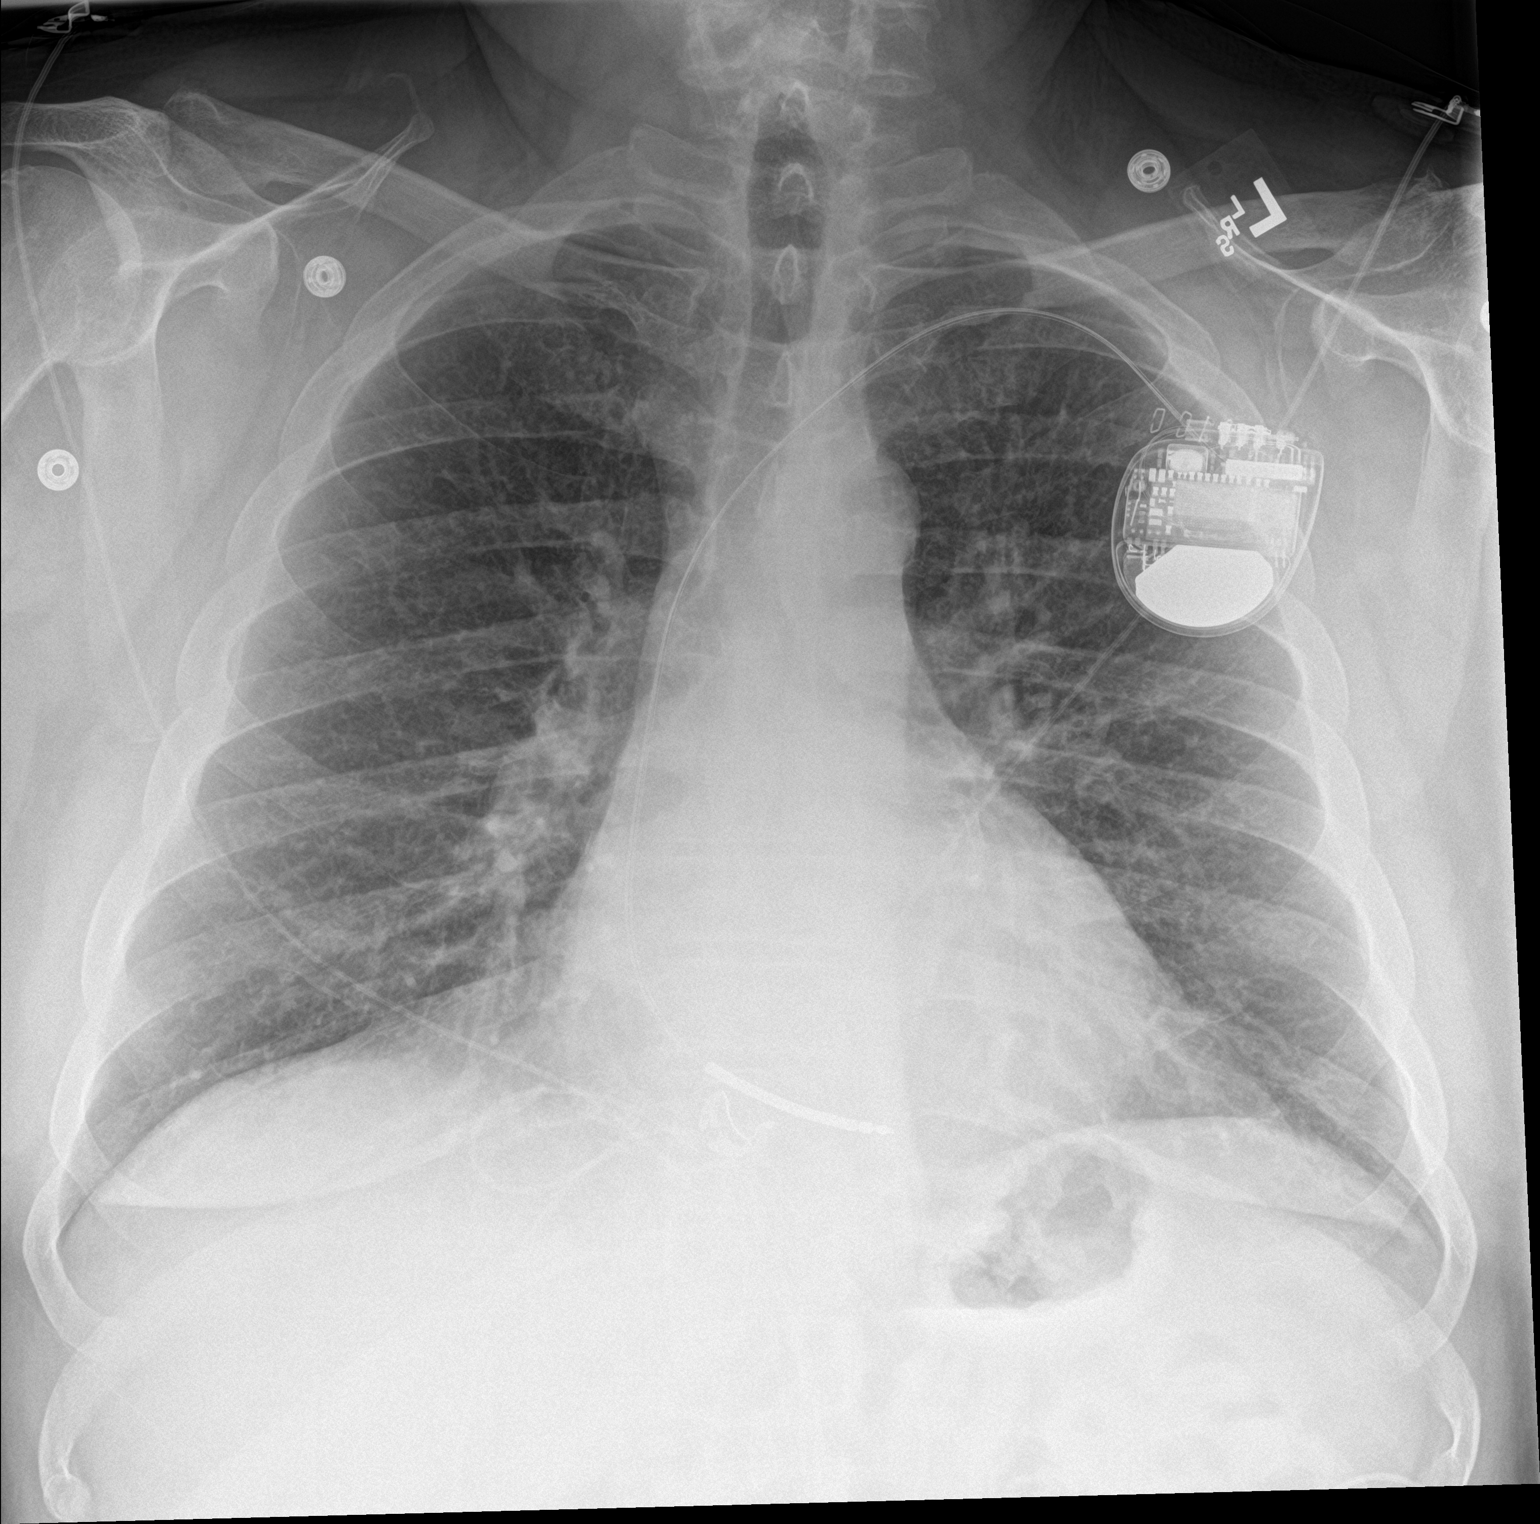

[chest lat]
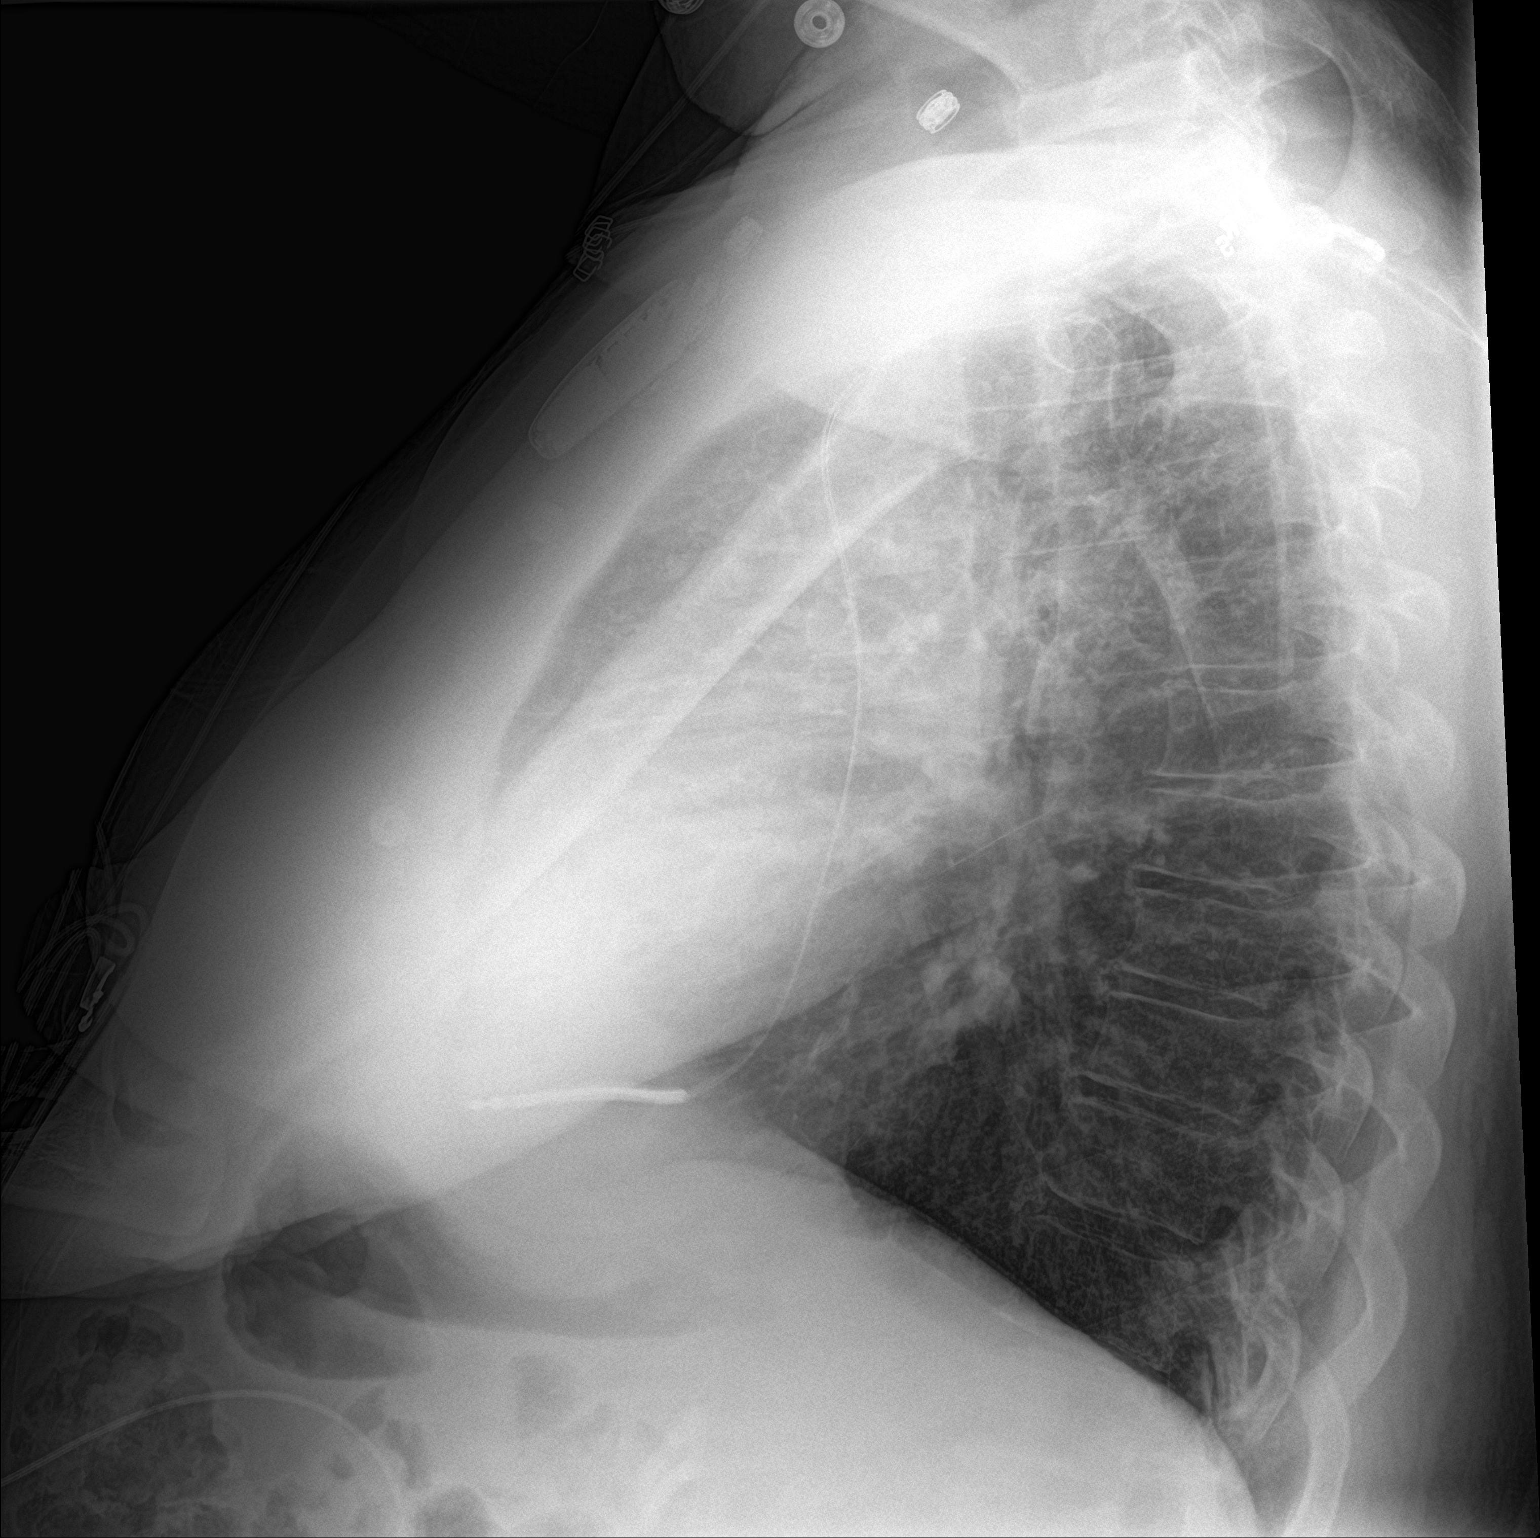

[2 of 2 positions shown; findings below may reference images not displayed]

FINDINGS: AICD noted with tip over right ventricle. Stable mild cardiomegaly.
Improved pulmonary venous congestion. Mild bibasilar atelectasis,
improved from prior exam. No pleural effusion or pneumothorax.
IMPRESSION: 1. Cardiac pacer with tip over right ventricle. Stable cardiomegaly.
Improved pulmonary venous congestion.

2.  Mild bibasilar atelectasis, improved from prior exam

## 2020-10-12 NOTE — Telephone Encounter (Signed)
    Elease Hashimoto said she will call [facility] to request copy of pt's pet scan send to Dr. Salena Saner and Dr. Allyson Sabal. She canceled pt's appt with Dr C on 12/30 due to pt will be out of town and will cb to /s on 01/03

## 2020-10-14 ENCOUNTER — Ambulatory Visit: Payer: 59 | Admitting: Cardiovascular Disease

## 2020-10-18 NOTE — Telephone Encounter (Signed)
The patient was calling to reschedule an appointment to have his defibrillator turned off. He was offered an appointment on 11/03/20 with Dr. Royann Shivers but wants it done this week. He has been made aware that Dr. Royann Shivers is rounding this week so is unable to do this. He stated that he was starting radiation next week and has to have it turned off before then.

## 2020-10-18 NOTE — Telephone Encounter (Signed)
Patient's spouse is following up to reschedule appointment with Dr. Royann Shivers to discuss defibrillator. She would like to know if patient can be worked in on Dr. Erin Hearing schedule again. Please advise.

## 2020-10-19 ENCOUNTER — Telehealth: Payer: Self-pay | Admitting: Cardiovascular Disease

## 2020-10-19 DIAGNOSIS — C259 Malignant neoplasm of pancreas, unspecified: Secondary | ICD-10-CM

## 2020-10-19 NOTE — Telephone Encounter (Signed)
Patient's daughter contacted and patient scheduled for appointment in Device Clinic on 10/26/20 per DR Croitoru to disable tachy therapies on [employer] ICD at 4 pm.

## 2020-10-19 NOTE — Telephone Encounter (Signed)
Cindy, I am aware of this and agree with turning off ICD therapies for this gentleman. Had him scheduled to see me in clinic twice to do that, but he rescheduled. I am in the hospital all week. Can you guys please see him in device clinic and do the reprogramming this week? I will put the order in . Thanks

## 2020-10-19 NOTE — Telephone Encounter (Signed)
Spoke with daughter on [place], Elease Hashimoto. She requests that patient's ICD tachy therapies be disabled before he starts radiation treatment for pancreatic cancer on 10/27/20. Elease Hashimoto reports that patient was given prognosis of 3-6 months to live due to diagnosis of pancreatic cancer and he does not want to be shocked by his ICD. Explained that Dr Royann Shivers will be notified of patient's wishes and he may want to speak to the patient to discuss things before he gives the order to turn off tachy therapies. Best number to contact patient is [phone].

## 2020-10-19 NOTE — Telephone Encounter (Signed)
Patient's daughter states that they need her father's defib turned off because he is having radiation treatments next week. Please advise.

## 2020-10-25 NOTE — Progress Notes (Signed)
Remote ICD transmission.   

## 2020-10-26 ENCOUNTER — Ambulatory Visit (INDEPENDENT_AMBULATORY_CARE_PROVIDER_SITE_OTHER): Payer: 59 | Admitting: Emergency Medicine

## 2020-10-26 ENCOUNTER — Other Ambulatory Visit: Payer: Self-pay

## 2020-10-26 DIAGNOSIS — Z95810 Presence of automatic (implantable) cardiac defibrillator: Secondary | ICD-10-CM | POA: Diagnosis not present

## 2020-10-26 LAB — CUP PACEART INCLINIC DEVICE CHECK
Battery Remaining Longevity: 124 mo
Battery Voltage: 3.03 V
Brady Statistic RV Percent Paced: 0.01 %
Date Time Interrogation Session: 20220111161414
HighPow Impedance: 62 Ohm
Implantable Lead Implant Date: 20191230
Implantable Lead Location: 753860
Implantable Pulse Generator Implant Date: 20191230
Lead Channel Impedance Value: 285 Ohm
Lead Channel Impedance Value: 361 Ohm
Lead Channel Pacing Threshold Amplitude: 0.625 V
Lead Channel Pacing Threshold Pulse Width: 0.4 ms
Lead Channel Sensing Intrinsic Amplitude: 9.75 mV
Lead Channel Sensing Intrinsic Amplitude: 9.75 mV
Lead Channel Setting Pacing Amplitude: 2.5 V
Lead Channel Setting Pacing Pulse Width: 0.4 ms
Lead Channel Setting Sensing Sensitivity: 0.3 mV

## 2020-10-26 NOTE — Progress Notes (Signed)
Patient in device clinic today to disable tachy therapies per Dr. Sallyanne Kuster. Complete with [employer] Rep present (Demi).  See attached report.

## 2020-11-13 ENCOUNTER — Other Ambulatory Visit: Payer: Self-pay | Admitting: Cardiovascular Disease

## 2021-01-14 DEATH — deceased

## 2021-08-02 ENCOUNTER — Telehealth: Payer: Self-pay

## 2021-08-02 NOTE — Telephone Encounter (Signed)
The patient wife states he passed away Jan 01, 2021. I marked the patient deceased in paceart and taken him out of [facility].
